# Patient Record
Sex: Female | Born: 1980 | Race: White | Hispanic: No | Marital: Married | Smoking: Current every day smoker
Health system: Southern US, Community
[De-identification: ages and names within clinical notes are randomized; demographics above are authoritative.]

## PROBLEM LIST (undated history)

## (undated) DIAGNOSIS — K219 Gastro-esophageal reflux disease without esophagitis: Secondary | ICD-10-CM

## (undated) DIAGNOSIS — R112 Nausea with vomiting, unspecified: Secondary | ICD-10-CM

## (undated) DIAGNOSIS — R7303 Prediabetes: Secondary | ICD-10-CM

## (undated) DIAGNOSIS — T4145XA Adverse effect of unspecified anesthetic, initial encounter: Secondary | ICD-10-CM

## (undated) DIAGNOSIS — J302 Other seasonal allergic rhinitis: Secondary | ICD-10-CM

## (undated) DIAGNOSIS — D649 Anemia, unspecified: Secondary | ICD-10-CM

## (undated) DIAGNOSIS — E282 Polycystic ovarian syndrome: Secondary | ICD-10-CM

## (undated) DIAGNOSIS — Z9889 Other specified postprocedural states: Secondary | ICD-10-CM

## (undated) DIAGNOSIS — T8859XA Other complications of anesthesia, initial encounter: Secondary | ICD-10-CM

## (undated) DIAGNOSIS — C801 Malignant (primary) neoplasm, unspecified: Secondary | ICD-10-CM

## (undated) HISTORY — PX: CERVICAL CERCLAGE: SHX1329

## (undated) HISTORY — PX: CHOLECYSTECTOMY: SHX55

---
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
## 2002-04-16 ENCOUNTER — Other Ambulatory Visit: Admission: RE | Admit: 2002-04-16 | Discharge: 2002-04-16 | Payer: Self-pay | Admitting: Obstetrics and Gynecology

## 2002-06-26 ENCOUNTER — Encounter: Payer: Self-pay | Admitting: Emergency Medicine

## 2002-06-26 ENCOUNTER — Emergency Department (HOSPITAL_COMMUNITY): Admission: EM | Admit: 2002-06-26 | Discharge: 2002-06-26 | Payer: Self-pay | Admitting: Emergency Medicine

## 2002-10-12 ENCOUNTER — Emergency Department (HOSPITAL_COMMUNITY): Admission: EM | Admit: 2002-10-12 | Discharge: 2002-10-12 | Payer: Self-pay | Admitting: Emergency Medicine

## 2002-10-12 ENCOUNTER — Encounter: Payer: Self-pay | Admitting: Emergency Medicine

## 2003-08-17 ENCOUNTER — Other Ambulatory Visit: Admission: RE | Admit: 2003-08-17 | Discharge: 2003-08-17 | Payer: Self-pay | Admitting: Obstetrics and Gynecology

## 2003-11-11 ENCOUNTER — Other Ambulatory Visit: Admission: RE | Admit: 2003-11-11 | Discharge: 2003-11-11 | Payer: Self-pay | Admitting: Obstetrics and Gynecology

## 2004-01-30 ENCOUNTER — Other Ambulatory Visit: Admission: RE | Admit: 2004-01-30 | Discharge: 2004-01-30 | Payer: Self-pay | Admitting: Obstetrics and Gynecology

## 2005-04-10 ENCOUNTER — Other Ambulatory Visit: Admission: RE | Admit: 2005-04-10 | Discharge: 2005-04-10 | Payer: Self-pay | Admitting: Obstetrics and Gynecology

## 2005-12-29 ENCOUNTER — Emergency Department (HOSPITAL_COMMUNITY): Admission: EM | Admit: 2005-12-29 | Discharge: 2005-12-30 | Payer: Self-pay | Admitting: Emergency Medicine

## 2006-10-27 ENCOUNTER — Inpatient Hospital Stay (HOSPITAL_COMMUNITY): Admission: AD | Admit: 2006-10-27 | Discharge: 2006-10-30 | Payer: Self-pay | Admitting: Obstetrics and Gynecology

## 2006-10-28 ENCOUNTER — Encounter: Payer: Self-pay | Admitting: Obstetrics and Gynecology

## 2006-12-10 ENCOUNTER — Inpatient Hospital Stay (HOSPITAL_COMMUNITY): Admission: AD | Admit: 2006-12-10 | Discharge: 2006-12-24 | Payer: Self-pay | Admitting: Obstetrics and Gynecology

## 2006-12-11 ENCOUNTER — Encounter: Payer: Self-pay | Admitting: Obstetrics and Gynecology

## 2006-12-18 ENCOUNTER — Encounter: Payer: Self-pay | Admitting: Obstetrics and Gynecology

## 2006-12-21 ENCOUNTER — Encounter (HOSPITAL_COMMUNITY): Payer: Self-pay | Admitting: Obstetrics and Gynecology

## 2007-07-09 ENCOUNTER — Emergency Department (HOSPITAL_COMMUNITY): Admission: EM | Admit: 2007-07-09 | Discharge: 2007-07-09 | Payer: Self-pay | Admitting: Emergency Medicine

## 2009-10-04 ENCOUNTER — Ambulatory Visit (HOSPITAL_COMMUNITY): Admission: RE | Admit: 2009-10-04 | Discharge: 2009-10-04 | Payer: Self-pay | Admitting: Obstetrics and Gynecology

## 2010-06-26 NOTE — Op Note (Signed)
NAMEDONDI, BURANDT                ACCOUNT NO.:  [id number]   MEDICAL RECORD NO.:  [id number]          PATIENT TYPE:  INP   LOCATION:                                FACILITY:  [facility]   PHYSICIAN:  Michelle L. Grewal, M.D.DATE OF BIRTH:  14-Oct-1980   DATE OF PROCEDURE:  10/29/2006  DATE OF DISCHARGE:  10/30/2006                               OPERATIVE REPORT   PREOP DIAGNOSES:  1. Twin intrauterine pregnancy at 17 weeks.  2. Shortened cervix.   POSTOP DIAGNOSES:  1. Twin intrauterine pregnancy at 17 weeks.  2. Shortened cervix.   PROCEDURE:  McDonald cervical cerclage.   SURGEON:  Dr. Vincente Poli.   ANESTHESIA:  Spinal.   SPECIMENS:  None.   EBL:  Minimal.   PROCEDURE:  Patient was taken to the operating room.  She was then given  her spinal.  She was prepped and draped.  An in-and-out catheter was  used to empty the bladder.  The patient was placed in Trendelenburg  gently which she tolerated very well.  A speculum was placed in the  vagina.  The cervix was noted to be closed but very shortened.  I  grasped the anterior and posterior lips of the cervix with cerclage  forceps and then used Mersilene suture and placed a stitch from the 12  o'clock to 3 o'clock and then 3 o'clock to 6 o'clock and then stitched  from 12 to 9 and then from 9 to 6 o'clock.  This is called McDonald  cervical cerclage.  The knot was tied at 6 o'clock.  At the end of the  procedure, the patient's cervical length had already lengthened some and  the cervix remained closed.  There was minimal bleeding with the  procedure.  The patient tolerated the procedure very well.  She went to  recovery room after all sponge, lap and instrument counts were correct  x2.      Michelle L. Vincente Poli, M.D.  Electronically Signed     MLG/MEDQ  D:  12/04/2006  T:  12/04/2006  Job:  [id number]

## 2010-06-26 NOTE — Op Note (Signed)
[name], [name]                ACCOUNT NO.:  [id number]   MEDICAL RECORD NO.:  [id number]          PATIENT TYPE:  INP   LOCATION:                                [facility]   PHYSICIAN:  Zelphia Cairo, MD    DATE OF BIRTH:  06/12/1980   DATE OF PROCEDURE:  12/21/2006  DATE OF DISCHARGE:  12/18/2006                               OPERATIVE REPORT   PREOPERATIVE DIAGNOSES:  1. Intrauterine pregnancy at 24-3/7 weeks.  2. Preterm labor.  3. Preterm premature rupture of membranes.   POSTOPERATIVE DIAGNOSES:  1. Intrauterine pregnancy at 24-3/7 weeks.  2. Preterm labor.  3. Preterm premature rupture of membranes.   PROCEDURE:  Primary classical cesarean section, cerclage removal.   SURGEON:  Zelphia Cairo, MD   ASSISTANT:  Allie Bossier, MD   ANESTHESIA:  Spinal, Dr. Jean Rosenthal.   FINDINGS:  Viable infant.  Baby A female 570 grams with Apgars of  1, 4  and 5.  Baby B female infant, 545 grams, Apgars of 2, 5 and 5, normal  pelvic anatomy.   COMPLICATIONS:  None.   CONDITION:  Stable to recovery room.   INDICATIONS:  [name] awoke approximately at 3 o'clock a.m. with onset of  increasing contractions.  She denied any vaginal bleeding, heart tones  appeared normal.  Magnesium tocolysis was restarted however, despite 3  grams of mag an hour, the patient continued to labor and make cervical  change.  Primary cesarean section was discussed with the patient due to  fetal lie of vertex transverse.   The patient was taken to the operating room where spinal anesthesia was  obtained.  She was placed in the supine position with the left tilt.  She was prepped and draped in sterile fashion and a Foley catheter was  inserted sterilely.  Pfannenstiel skin incision was made with the  scalpel and this was carried down to the underlying fascia.  The fascia  was incised in the midline.  This was extended laterally using Mayo  scissors.  Kocher clamps were used to grasp the superior portion of  the  fascia.  This was tented upwards and the underlying rectus muscles were  dissected off using the Bovie.  The anterior fascia was tented upwards  and the underlying rectus muscles were dissected off using Mayo  scissors.  Peritoneum was then identified and entered sharply using  Metzenbaum scissors.  This was extended bluntly.  Bladder blade was then  inserted, a bladder flap was created using Metzenbaum scissors.  The  bladder blade was reinserted.  A vertical uterine incision was then made  using the scalpel.  Purulent fluid was noted upon entry to the uterus.  The uterine incision was extended using bandage scissors.  Baby A was  found to be in the vertex position, however low in the pelvis and  delivered using standard breech maneuvers.  Cord was clamped and cut and  the infant was taken to the awaiting pediatric staff.  Baby B was noted  to be transverse position at this time.  Fetal vertex or feet could  not  be grasped.  Membranes were ruptured for clear fluid.  Due to increased  uterine tone, presenting part could not be brought into the uterine  incision. Nitroglycerin was given for relaxation and the fetus was  delivered using standard breech maneuvers.  Cord was clamped and cut and  the infant was taken to the awaiting pediatric staff.  The placenta was  then removed manually.  The uterus was exteriorized from the pelvis and  cleared of all clots and debris using a dry lap sponge.  The uterine  incision was closed into layers using running locked chromic stitch.  The uterus was then placed back into the pelvic cavity.  The pelvis was  copiously irrigated with warm normal saline.  The uterine incision was  reinspected and found to be hemostatic.  The peritoneum was then closed  with 0 Monocryl.  The fascia was closed with 0 PDS.  The subcutaneous  tissue was reapproximated using interrupted plain gut suture and the  skin was closed with staples.  The patient tolerated the  procedure well.  Sponge, lap, needle and instrument counts were correct x2.      Zelphia Cairo, MD  Electronically Signed     GA/MEDQ  D:  12/21/2006  T:  12/22/2006  Job:  [id number]

## 2010-06-26 NOTE — Discharge Summary (Signed)
[name], CADLE                ACCOUNT NO.:  [id number]   MEDICAL RECORD NO.:  [id number]          PATIENT TYPE:  INP   LOCATION:  9157                          [facility]ACILITY:  [facility]   PHYSICIAN:  Michelle L. Grewal, M.D.DATE OF BIRTH:  1981-01-19   DATE OF ADMISSION:  10/27/2006  DATE OF DISCHARGE:  10/30/2006                               DISCHARGE SUMMARY   ADMITTING DIAGNOSES:  Intrauterine pregnancy at 16 weeks twin gestation  with nausea and vomiting and questionable incompetent cervix.   DISCHARGE DIAGNOSES:  1. Status post cervical cerclage.  2. Intrauterine pregnancy at 79 and 3/7 weeks estimated gestational      age.  3. Twin gestation.   PROCEDURE:  McDonald cervical cerclage.   REASON FOR ADMISSION:  Please see written H&P.   HOSPITAL COURSE:  The patient is 30 year old primigravida that was  admitted to [facility] at 16 weeks estimated  gestational age with a twin gestation initially for nausea, vomiting,  but fetal heart tones were unable to be auscultated.  Sonogram was  performed which revealed viable twins.  However, the cervix was noted to  be 1.04 cm in length.  Monitor did not reveal any contractions.  Cervix  was a finger tip, 50% effaced.  It was thought that the patient did have  a possible incompetent cervix.  The patient was now admitted for bedrest  and possible cerclage.  On the following morning the maternal fetal  medicine was consulted for consultation.  The patient was without  complaint.  She denied any cramping, vaginal bleeding or rupture of  membranes.  Vital signs were stable.  She is afebrile.  Fetal heart  tones were auscultated.  Cervix was reexamined and thought to be closed,  approximately 2 cm in length.  The patient continued on IV hydration.  Maternal fetal medicine report said that the fetal anatomy was normal.  However, could not complete the anatomy due to the gestational age.  Cervical length was noted to be 1.7 cm  and it was suggested that the  patient undergo a cervical cerclage and Prometrium.  On the following  morning the patient without complaint.  She denied any leakage of fluid  or vaginal bleeding.  Vital signs were stable.  She was afebrile.  The  patient was now scheduled for cervical cerclage.  The patient was taken  to the operating room where cervical cerclage was placed without  difficulty.  The patient tolerated procedure well and taken to the  recovery room in stable condition.  On the following morning the patient  was doing well.  She denied any uterine contraction or vaginal bleeding,  no contractions were noted on the monitor.  The instructions were given  and the patient was later discharged home.   CONDITION ON DISCHARGE:  Stable.   DIET:  Regular as tolerated.   ACTIVITY:  Bedrest with bathroom privileges.   The patient is to call for decrease in fetal movement, rupture of  membranes or vaginal bleeding or increasing pelvic pressure.   DISCHARGE MEDICATIONS:  Prometrium 200 mg one intravaginal at h.s.,  Phenergan 12.5 mg one p.o. every 12 hours as needed for nausea, prenatal  vitamins one p.o. daily.      Julio Sicks, N.P.      Stann Mainland. Vincente Poli, M.D.  Electronically Signed    CC/MEDQ  D:  11/30/2006  T:  12/01/2006  Job:  [id number]

## 2010-06-29 NOTE — Procedures (Signed)
[place]  Patient:    [name], [name]                          MRN: [id number] Proc. Date: 06/18/00 Adm. Date:  [id number] Attending:  Charna Elizabeth CC:         Kern Reap, M.D.                           Procedure Report  DATE OF BIRTH:  [birth year].  PROCEDURE:  Esophagogastroduodenoscopy.  ENDOSCOPIST:  Anselmo Rod, M.D.  INSTRUMENT USED:  Olympus video panendoscope.  INDICATION FOR PROCEDURE:  An 30 year old white female with a history of epigastric pain, nausea and vomiting.  Rule out peptic ulcer disease, outlet obstruction, esophagitis, etc.  PREPROCEDURE PREPARATION:  Informed consent was procured from the patient. The patient was fasted for eight hours prior to the procedure.  PREPROCEDURE PHYSICAL:  VITAL SIGNS:  The patient had stable vital signs.  NECK:  Supple.  CHEST:  Clear to auscultation.  S1, S2 regular.  ABDOMEN:  Soft with epigastric tenderness on palpation with guarding.  No rebound or rigidity.  No hepatosplenomegaly.  No masses palpable.  DESCRIPTION OF PROCEDURE:  The patient was placed in the left lateral decubitus position and sedated with 100 mg of Demerol and 10 mg of intravenously.  Once the patient was adequately sedate and maintained on low-flow oxygen and continuous cardiac monitoring, the Olympus video panendoscope was advanced through the mouthpiece, over the tongue, into the esophagus under direct vision.  The entire esophagus appeared normal without evidence of ring, stricture, masses, lesions, or esophagitis.  The scope was then advanced to the stomach.  There was a large amount of debris in the stomach, and the scope could not be advanced beyond the pylorus.  The procedure was aborted at that point.  IMPRESSION: 1. Incomplete endoscopy secondary to a large amount of residual debris in the    stomach consistent with gastroparesis. 2. Normal-appearing esophagus.  RECOMMENDATIONS: 1.  Gastric emptying study has been scheduled for the patient. 2. A CBC with differential, CMET, and  a hemoglobin A1C will be checked today. 3. An abdominal ultrasound and HIDA scan will also be done to rule out    gallbladder pathology. 4. Patient is to continue her PPIs for now and avoid all nonsteroidals,    including aspirin. 5. Outpatient follow-up is advised within the next week. DD:  06/18/00 TD:  06/19/00 Job: [id number]

## 2010-06-29 NOTE — Discharge Summary (Signed)
[name], [name]                ACCOUNT NO.:  [id number]   MEDICAL RECORD NO.:  [id number]          PATIENT TYPE:  INP   LOCATION:  9304                          FACILITY:  [facility]   PHYSICIAN:  [name], M.D.   DATE OF BIRTH:  December 20, 1980   DATE OF ADMISSION:  12/10/2006  DATE OF DISCHARGE:  12/24/2006                               DISCHARGE SUMMARY   ADMITTING DIAGNOSES:  1. Intrauterine pregnancy at 22-6/7 weeks estimated gestational age,      twin gestation.  2. Twin gestation.  3. Incompetent cervix.  4. Preterm labor.   DISCHARGE DIAGNOSES:  1. Status post low classical cesarean delivery.  2. Viable twin infant female and female infant.  3. Cerclage removal.   PROCEDURES:  1. Primary classical cesarean section.  2. Cerclage removal.   REASON FOR ADMISSION:  Please see written H&P.   HOSPITAL COURSE:  The patient is a 30 year old white married female,  primigravida that was admitted to [facility] at 22-6/7  weeks estimated gestational age for observation and bedrest.  The  patient had been seen in the office where an ultrasound had revealed  cervical length of 0.7 cm and funneling of the lower uterine segment.  Ultrasound had also revealed that there was adequate amniotic fluid  volume and symmetrical growth of twin infants.  The patient was placed  on fetal monitoring, which had revealed no contractions observable by  tocometer.  However, given shortening of the cervix, the patient was  started on progesterone vaginal suppositories.  The patient was  monitored closely and continued on bedrest.  Fetal heart tones continued  to be reactive in the 140s.  The patient was placed on magnesium sulfate  for tocolysis of any contraction pattern.  Cerclage was seen and seemed  to be intact.  Fetal heart tones remained reactive.  The patient did  receive betamethasone for acceleration of the fetal lung maturity.  She  also had been placed on compression  stockings for prevention of deep  vein thrombosis.  The patient did undergo glucose testing, which she had  failed a 3-hour glucose tolerance test and was now started on American  Diabetes diet and metformin.  At approximately 24-2/7 weeks, the patient  did report a large gush of fluid, which was positive for Nitrazine.  Ultrasound revealed a decrease in amniotic fluid on both baby A and B.  She was placed on prophylactic antibiotics.  On the following day, the  patient did complain of some contractions.  She was now started back on  magnesium sulfate which was increased to 3 grams per hour.  The patient  continued to be uncomfortable.  No vaginal bleeding noted.  Contractions  were approximately every 3 minutes.  Exam revealed cervix 1 cm with  presenting part palpable.  Cerclage continued to be in place.  Due to  continued labor at 3 grams per hour, decision was made to proceed with a  classical cesarean delivery.  The patient was then transferred to the  operating room where spinal anesthesia was placed without difficulty.  A  low  classical incision was made with the delivery of a viable infant A  female weighing 1 pound, 4 ounces, Apgars of 1 at 1 minute, 4 at 5  minutes, 5 at 10 minutes; and baby B, a female infant weighing 1 pound, 3-  1/2 ounces with Apgars of 2 at 1 minute, 5 at 5 minutes, 5 at 10  minutes.  Babies were taken to the Neonatal Intensive Care Unit, and  mother tolerated the procedure well and was taken to the recovery room  in stable condition.  On postoperative day #1, mother was without  complaint.  Babies were both stable in the NICU.  Vital signs were  stable.  She was afebrile.  Abdomen was soft.  Fundus firm and  nontender.  Abdominal dressings were noted to have a scant amount of  drainage on the bandage.  Foley had been discontinued.  However, the  patient had not voided at the time of rounding.  Laboratory findings  showed a hemoglobin of 9.5, platelet count of  245,000, WBC count of  10.5.  On postoperative day #2, the patient was without complaint.  Vital signs were stable.  Fundus firm and nontender.  Incision was  clean, dry, and intact.  She was ambulating well.  On postoperative day  #3, she was somewhat tearful.  Babies, however, were stable in the NICU.  Vital signs were stable.  She was afebrile.  Abdomen was soft.  Fundus  firm and nontender.  Incision was clean, dry, and intact.  Staples were  removed, and the patient was later discharged home.   CONDITION ON DISCHARGE:  Stable.   DIET:  Regular as tolerated.   ACTIVITY:  No heavy lifting.  No driving x2 weeks.  No vaginal entry.   FOLLOW UP:  The patient will follow up in the office in 1-2 weeks for an  incision check.  She is to call for temperature greater than 100  degrees, persistent nausea, vomiting, heavy vaginal bleeding and/or  redness drainage from the incisional site.   DISCHARGE MEDICATIONS:  Percocet 5/325, #30, one by mouth every 4 to 6  hours p.r.n.; Motrin 600 mg by mouth every 6 hours; Zoloft 25 mg x1  week, then 50 mg daily; prenatal vitamins one by mouth daily.      [name], N.P.      [name], M.D.  Electronically Signed    CC/MEDQ  D:  02/11/2007  T:  02/11/2007  Job:  [id number]

## 2010-06-29 NOTE — Op Note (Signed)
[facility]  Patient:    [name], [name]                          MRN: [id number] Proc. Date: 08/01/00 Adm. Date:  [id number] Attending:  Arlis Porta CC:         Kern Reap, M.D.  Anselmo Rod, M.D.   Operative Report  PREOPERATIVE DIAGNOSIS:  Biliary dyskinesia.  POSTOPERATIVE DIAGNOSIS:  Biliary dyskinesia.  PROCEDURE:  Laparoscopic cholecystectomy with intraoperative cholangiogram.  SURGEON:  Adolph Pollack, M.D.  ASSISTANT:  Thornton Park. Daphine Deutscher, M.D.  ANESTHESIA:  General.  INDICATIONS:  [name] is an 30 year old female, who has been having some abdominal pain and hematemesis.  She has postprandial nausea and some epigastric and right upper quadrant type pains.  She underwent upper endoscopy which demonstrated a large amount of residual debris in the stomach.  She had delayed gastric emptying and was treated with Reglan with improvement in symptoms.  However, she continues to have some postprandial right upper quadrant and epigastric pain with nausea.  She has had an ultrasound of the gallbladder which was normal.  However, hepatobiliary scan showed delayed ejection fraction consistent with biliary dyskinesia.  She has been tried on antispasmodics for possible irritable bowel, but this has not helped the symptoms.  She now presents for elected cholecystectomy.  The procedure risks and potential success of the procedure with respect to relieving symptoms (60-80%) has been discussed with her.  TECHNIQUE:  She was placed supine on the operating table, and general anesthetic was administered.  The abdomen was sterilely prepped and draped. Local anesthetic consisting of 0.5% plain Marcaine was infiltrated in the subumbilical region and incision made and carried down through the subcutaneous tissue to the fascia where a 1.5 cm incision was made in the midline fascia.  A pursestring suture of 0 Vicryl was placed around  the fascial edges.  The peritoneal cavity was then entered bluntly under direct vision.  A Hasson trocar was introduced into the peritoneal cavity, and a pneumoperitoneum was created by insufflation of C02 gas.  Next, the laparoscope was introduced.  Laparoscope was introduced, and the surface of the liver appeared normal.  The surface of the stomach appeared normal.  Under direct vision, an 11 mm incision in the epigastrium through which a similar sized trocar was inserted. Two 5 mm trocars were placed through a small incision in the right abdomen. The fundus was grasped and the infundibulum identified.  The infundibulum was completely mobilized.  I then was able to dissect the cystic duct at its junction with the gallbladder and for a distance.  I put a clip up on the gallbladder at the gallbladder neck.  An incision was made at the cystic duct gallbladder junction.  A Cholangiocath was passed through the anterior abdominal wall, and the cholangiogram was performed.  With real-time fluoroscopy, the dilute contrast material was injected into the cystic duct.  The cystic duct was long and thin.  The material went rapidly through the common bile duct into the duodenum.  I also noted the common hepatic and right and left hepatic ducts.  No obstructive lesions were apparent.  Final reading is per the radiologist.  I removed the Cholangiocath catheter. I clipped the cystic duct three times proximally and divided it.  The cystic artery was clipped and divided.  The gallbladder was dissected free from the liver bed.  There was a small puncture wound in  the gallbladder and bile leaked out.  Once I removed the gallbladder from its fossa, I placed it in an Endopouch bag and irrigated out the perihepatic area.  I then inspected the liver bed and noticed some raw surface from which there was some bleeding.  I controlled this with the cautery.  Once there was hemostasis, I then applied Surgicel to  the raw surface.  I removed the gallbladder in the Endopouch bag through the subumbilical incision and then closed the fascial defect under direct vision by tightening up and tying down the pursestring suture.  I subsequently irrigated out the perihepatic area and evacuated all of the fluid until it was clear.  I then removed all of the trocars and released the pneumoperitoneum.  The skin incisions were closed with 4-0 Monocryl subcuticular stitches followed by Steri-Strips and sterile dressings.  She tolerated the procedure well without any apparent complications and was taken to the recovery room in satisfactory condition. DD:  08/01/00 TD:  08/01/00 Job: [id number]

## 2010-11-07 LAB — URINALYSIS, ROUTINE W REFLEX MICROSCOPIC
Ketones, ur: 15 — AB
Nitrite: NEGATIVE
pH: 5.5

## 2010-11-07 LAB — DIFFERENTIAL
Basophils Absolute: 0
Eosinophils Absolute: 0.2
Eosinophils Relative: 1
Lymphocytes Relative: 13
Monocytes Absolute: 0.6

## 2010-11-07 LAB — COMPREHENSIVE METABOLIC PANEL
ALT: 34
AST: 31
Albumin: 4.1
Calcium: 9.2
GFR calc Af Amer: >60
Potassium: 4.1
Sodium: 139
Total Protein: 7.2

## 2010-11-07 LAB — CBC
HCT: 42.8
Hemoglobin: 14.6
MCHC: 34
MCV: 79.6
RDW: 14.9

## 2010-11-07 LAB — URINE MICROSCOPIC-ADD ON

## 2010-11-07 LAB — WET PREP, GENITAL: Yeast Wet Prep HPF POC: NONE SEEN

## 2010-11-07 LAB — RPR: RPR Ser Ql: NONREACTIVE

## 2010-11-07 LAB — GC/CHLAMYDIA PROBE AMP, GENITAL: Chlamydia, DNA Probe: NEGATIVE

## 2010-11-20 LAB — CBC
HCT: 27.7 — ABNORMAL LOW
HCT: 30.6 — ABNORMAL LOW
HCT: 33.1 — ABNORMAL LOW
Hemoglobin: 10.6 — ABNORMAL LOW
Hemoglobin: 9.5 — ABNORMAL LOW
MCHC: 34.5
MCV: 85.4
MCV: 86
Platelets: 235
Platelets: 285
RBC: 3.22 — ABNORMAL LOW
RBC: 3.85 — ABNORMAL LOW
RDW: 13.8
RDW: 14.2
WBC: 17 — ABNORMAL HIGH

## 2010-11-20 LAB — URINE CULTURE
Colony Count: 10000
Special Requests: NEGATIVE

## 2010-11-20 LAB — URINALYSIS, ROUTINE W REFLEX MICROSCOPIC
Bilirubin Urine: NEGATIVE
Glucose, UA: NEGATIVE
Protein, ur: NEGATIVE
Urobilinogen, UA: 0.2

## 2010-11-20 LAB — TYPE AND SCREEN: ABO/RH(D): O POS

## 2010-11-20 LAB — GLUCOSE TOLERANCE, 1 HOUR: Glucose, 1 Hour GTT: 150 — ABNORMAL HIGH

## 2010-11-20 LAB — GLUCOSE, 2 HOUR GESTATIONAL: Glucose Tolerance, 2 hour: 254 — ABNORMAL HIGH

## 2010-11-20 LAB — ABO/RH: ABO/RH(D): O POS

## 2010-11-21 LAB — CBC
Hemoglobin: 11.3 — ABNORMAL LOW
MCHC: 35
MCV: 85.5
RBC: 3.79 — ABNORMAL LOW
RDW: 14.2 — ABNORMAL HIGH

## 2010-11-22 LAB — URINALYSIS, ROUTINE W REFLEX MICROSCOPIC
Bilirubin Urine: NEGATIVE
Glucose, UA: NEGATIVE
Hgb urine dipstick: NEGATIVE
Ketones, ur: 15 — AB
Protein, ur: NEGATIVE
Urobilinogen, UA: 0.2

## 2011-07-10 ENCOUNTER — Other Ambulatory Visit: Payer: Self-pay | Admitting: Obstetrics and Gynecology

## 2012-07-29 ENCOUNTER — Other Ambulatory Visit: Payer: Self-pay | Admitting: Obstetrics and Gynecology

## 2012-10-01 ENCOUNTER — Ambulatory Visit: Payer: Self-pay | Admitting: Cardiovascular Disease

## 2013-08-18 ENCOUNTER — Other Ambulatory Visit: Payer: Self-pay | Admitting: Obstetrics and Gynecology

## 2013-08-20 LAB — CYTOLOGY - PAP

## 2014-08-29 ENCOUNTER — Other Ambulatory Visit: Payer: Self-pay | Admitting: Obstetrics and Gynecology

## 2014-08-30 LAB — CYTOLOGY - PAP

## 2016-02-12 NOTE — L&D Delivery Note (Signed)
Patient delivered spontaneously a non viable fetus  Called by nurse shortly after delivery  Exam  Placenta still undelivered Bleeding is minimal 277mcg cytotec inserted in vagina to assist in delivery Of placenta  Plan of care reviewed at bedside

## 2016-03-19 LAB — OB RESULTS CONSOLE RPR: RPR: NONREACTIVE

## 2016-03-19 LAB — OB RESULTS CONSOLE GBS: GBS: NEGATIVE

## 2016-03-19 LAB — OB RESULTS CONSOLE RUBELLA ANTIBODY, IGM: Rubella: NON-IMMUNE/NOT IMMUNE

## 2016-03-19 LAB — OB RESULTS CONSOLE HEPATITIS B SURFACE ANTIGEN: Hepatitis B Surface Ag: NEGATIVE

## 2016-03-19 LAB — OB RESULTS CONSOLE ABO/RH: RH Type: POSITIVE

## 2016-03-19 LAB — OB RESULTS CONSOLE HIV ANTIBODY (ROUTINE TESTING): HIV: NONREACTIVE

## 2016-04-12 NOTE — Patient Instructions (Signed)
Your procedure is scheduled on:  Thursday, April 18, 2016  Enter through the [employer] [facility] at:  12 noon  Pick up the phone at the desk and dial [phone].  Call this number if you have problems the morning of surgery: [phone].  Remember: Do NOT eat food:  After 5:30 AM day of surgery  Do NOT drink clear liquids after:  9:30 AM day of surgery  Take these medicines the morning of surgery with a SIP OF WATER:  None  Do NOT take Metformin the evening before day of surgery  Stop ALL herbal medications at this time  Do NOT smoke the day of surgery.  Do NOT wear jewelry (body piercing), metal hair clips/bobby pins, make-up, or nail polish. Do NOT wear lotions, powders, or perfumes.  You may wear deodorant. Do NOT shave for 48 hours prior to surgery. Do NOT bring valuables to the hospital. Contacts, dentures, or bridgework may not be worn into surgery.  Have a responsible adult drive you home and stay with you for 24 hours after your procedure  Bring a copy of your healthcare power of attorney and living will documents.  **Effective Friday, Jan. 12, 2018, [place] will implement no hospital visitations from children age 60 and younger due to a steady increase in flu activity in our community and hospitals. **

## 2016-04-15 ENCOUNTER — Encounter (HOSPITAL_COMMUNITY): Payer: Self-pay | Admitting: *Deleted

## 2016-04-15 ENCOUNTER — Inpatient Hospital Stay (HOSPITAL_COMMUNITY): Payer: No Typology Code available for payment source

## 2016-04-15 ENCOUNTER — Encounter (HOSPITAL_COMMUNITY)
Admission: RE | Admit: 2016-04-15 | Discharge: 2016-04-15 | Disposition: A | Discharge status: Home / Self Care | Payer: No Typology Code available for payment source | Source: Ambulatory Visit | Attending: Obstetrics and Gynecology | Admitting: Obstetrics and Gynecology

## 2016-04-15 ENCOUNTER — Observation Stay (HOSPITAL_COMMUNITY)
Admission: AD | Admit: 2016-04-15 | Discharge: 2016-04-16 | Disposition: A | Discharge status: Home / Self Care | Payer: No Typology Code available for payment source | Source: Ambulatory Visit | Attending: Obstetrics and Gynecology | Admitting: Obstetrics and Gynecology

## 2016-04-15 ENCOUNTER — Encounter (HOSPITAL_COMMUNITY): Payer: Self-pay

## 2016-04-15 DIAGNOSIS — O42112 Preterm premature rupture of membranes, onset of labor more than 24 hours following rupture, second trimester: Secondary | ICD-10-CM | POA: Diagnosis present

## 2016-04-15 DIAGNOSIS — O4290 Premature rupture of membranes, unspecified as to length of time between rupture and onset of labor, unspecified weeks of gestation: Secondary | ICD-10-CM | POA: Diagnosis not present

## 2016-04-15 DIAGNOSIS — Z3A15 15 weeks gestation of pregnancy: Secondary | ICD-10-CM | POA: Insufficient documentation

## 2016-04-15 DIAGNOSIS — Z7984 Long term (current) use of oral hypoglycemic drugs: Secondary | ICD-10-CM | POA: Insufficient documentation

## 2016-04-15 DIAGNOSIS — O2622 Pregnancy care for patient with recurrent pregnancy loss, second trimester: Secondary | ICD-10-CM | POA: Insufficient documentation

## 2016-04-15 DIAGNOSIS — O42912 Preterm premature rupture of membranes, unspecified as to length of time between rupture and onset of labor, second trimester: Secondary | ICD-10-CM | POA: Diagnosis not present

## 2016-04-15 DIAGNOSIS — O2620 Pregnancy care for patient with recurrent pregnancy loss, unspecified trimester: Secondary | ICD-10-CM

## 2016-04-15 DIAGNOSIS — Z3492 Encounter for supervision of normal pregnancy, unspecified, second trimester: Secondary | ICD-10-CM

## 2016-04-15 HISTORY — DX: Gastro-esophageal reflux disease without esophagitis: K21.9

## 2016-04-15 HISTORY — DX: Other complications of anesthesia, initial encounter: T88.59XA

## 2016-04-15 HISTORY — DX: Nausea with vomiting, unspecified: R11.2

## 2016-04-15 HISTORY — DX: Adverse effect of unspecified anesthetic, initial encounter: T41.45XA

## 2016-04-15 HISTORY — DX: Other specified postprocedural states: Z98.890

## 2016-04-15 HISTORY — DX: Polycystic ovarian syndrome: E28.2

## 2016-04-15 HISTORY — DX: Anemia, unspecified: D64.9

## 2016-04-15 HISTORY — DX: Other seasonal allergic rhinitis: J30.2

## 2016-04-15 LAB — URINALYSIS, ROUTINE W REFLEX MICROSCOPIC
Bilirubin Urine: NEGATIVE
Glucose, UA: NEGATIVE mg/dL
HGB URINE DIPSTICK: NEGATIVE
Ketones, ur: NEGATIVE mg/dL
LEUKOCYTES UA: NEGATIVE
Nitrite: NEGATIVE
PROTEIN: NEGATIVE mg/dL
Specific Gravity, Urine: 1.002 — ABNORMAL LOW (ref 1.005–1.030)
pH: 8 (ref 5.0–8.0)

## 2016-04-15 LAB — TYPE AND SCREEN
ABO/RH(D): O POS
Antibody Screen: NEGATIVE

## 2016-04-15 LAB — CBC
HCT: 41.3 % (ref 36.0–46.0)
HEMOGLOBIN: 14 g/dL (ref 12.0–15.0)
MCH: 27.7 pg (ref 26.0–34.0)
MCHC: 33.9 g/dL (ref 30.0–36.0)
MCV: 81.8 fL (ref 78.0–100.0)
PLATELETS: 204 10*3/uL (ref 150–400)
RBC: 5.05 MIL/uL (ref 3.87–5.11)
RDW: 14.6 % (ref 11.5–15.5)
WBC: 11.2 10*3/uL — AB (ref 4.0–10.5)

## 2016-04-15 LAB — BASIC METABOLIC PANEL
Anion gap: 11 (ref 5–15)
BUN: 8 mg/dL (ref 6–20)
CALCIUM: 8.9 mg/dL (ref 8.9–10.3)
CO2: 17 mmol/L — AB (ref 22–32)
CREATININE: 0.57 mg/dL (ref 0.44–1.00)
Chloride: 104 mmol/L (ref 101–111)
GFR calc Af Amer: >60 mL/min (ref >60)
GFR calc non Af Amer: >60 mL/min (ref >60)
GLUCOSE: 114 mg/dL — AB (ref 65–99)
Potassium: 3.5 mmol/L (ref 3.5–5.1)
Sodium: 132 mmol/L — ABNORMAL LOW (ref 135–145)

## 2016-04-15 LAB — AMNISURE RUPTURE OF MEMBRANE (ROM) NOT AT ARMC: Amnisure ROM: POSITIVE

## 2016-04-15 MED ORDER — ACETAMINOPHEN 500 MG PO TABS
1000.0000 mg | ORAL_TABLET | Freq: Three times a day (TID) | ORAL | Status: DC | PRN
  Administered 2016-04-15: 1000 mg via ORAL
  Filled 2016-04-15: qty 2

## 2016-04-15 MED ORDER — PRENATAL MULTIVITAMIN CH: 1.0000 | ORAL_TABLET | Freq: Every day | ORAL | Status: DC

## 2016-04-15 NOTE — H&P (Signed)
[name] is a 36 y.o. female presenting for early preg, ?leaking fluid, pt w/ hx incompt cx>>sched for cerclage this week. OB History    Gravida Para Term Preterm AB Living   4 1   1 2      SAB TAB Ectopic Multiple Live Births   2     1 2      Past Medical History:  Diagnosis Date  . Anemia    history with pregnancy  . Complication of anesthesia    difficult time waking up after gallbladder surgery, blood pressure drop with C section after babies delivered  . GERD (gastroesophageal reflux disease)   . PCOS (polycystic ovarian syndrome)   . PONV (postoperative nausea and vomiting)   . Seasonal allergies    Past Surgical History:  Procedure Laterality Date  . CERVICAL CERCLAGE    . CESAREAN SECTION    . CHOLECYSTECTOMY     Family History: family history is not on file. Social History:  reports that she has never smoked. She has never used smokeless tobacco. She reports that she drinks alcohol. She reports that she does not use drugs.     Maternal Diabetes: No Genetic Screening: N/A Maternal Ultrasounds/Referrals: Normal Fetal Ultrasounds or other Referrals:  None Maternal Substance Abuse:  No Significant Maternal Medications:  None Significant Maternal Lab Results:  None Other Comments:  None  Review of Systems  Constitutional: Negative.   HENT: Negative.   Eyes: Negative.   Respiratory: Negative.   Gastrointestinal: Negative.   Genitourinary: Negative.   Musculoskeletal: Negative.   Skin: Negative.   Neurological: Negative.   Endo/Heme/Allergies: Negative.   Psychiatric/Behavioral: Negative.    Maternal Medical History:  Reason for admission: Rupture of membranes.   Contractions: Onset was 3-5 hours ago.   Frequency: rare.        Blood pressure 134/81, pulse 115, temperature 98.4 F (36.9 C), temperature source Oral, resp. rate 20, weight 105.1 kg (231 lb 12 oz), last menstrual period 01/02/2016, SpO2 100 %. Exam Physical Exam  Constitutional: She  is oriented to person, place, and time. She appears well-nourished.  HENT:  Head: Normocephalic and atraumatic.  Neck: Normal range of motion. Neck supple.  Cardiovascular: Normal rate and regular rhythm.   Respiratory: Effort normal and breath sounds normal.  GI: Bowel sounds are normal.  Genitourinary:  Genitourinary Comments: Per NP exam>>opaque fluid in vag + AMNISURE  Musculoskeletal: Normal range of motion.  Neurological: She is alert and oriented to person, place, and time.    Prenatal labs: ABO, Rh: --/--/O POS (03/05 1111) Antibody: NEG (03/05 1111) Rubella:   RPR:    HBsAg:    HIV:    GBS:     Assessment/Plan: [redacted]w[redacted]d Hx incomt cx>>sched for cerclage this week + AMNISURE and US showing low AF>>>offered OPT mgmt, pt request overnight eval, plan f/U US in am   [facility] M 04/15/2016, 9:40 PM

## 2016-04-15 NOTE — MAU Provider Note (Signed)
History     CSN: [id number]  Arrival date and time: 04/15/16 [id number]   First Provider Initiated Contact with Patient 04/15/16 1818      Chief Complaint  Patient presents with  . Rupture of Membranes   HPI [name] is 36 y.o. G4P0120 [redacted]w[redacted]d weeks by date but patient states she is [redacted]w[redacted]d by U/S X 3 presenting with concerns of premature rupture of membranes. Hx of PCOS, irregular cycles.  Last BM 4 days ago, thought she would go today, unsuccesffully and while on toilet had leaking, didn't know if urine.  15 mins later while sitting on couch had another gush of fluid. Went to [facility] and was able to void and felt more leaking 15 mins later.  "Felt like leaking with cough or sneezing but I am not doing either"  Hx of rescue cerclage at [redacted] weeks gestation with twin pregnancy.  Pregnancy loss at 24 weeks.  2 SABs after pregnancy loss. She states no changes in cervix with this pregnancy.  Cerclage is scheduled for Thursday 3/8.  Neg for vaginal bleeding or abdominal cramping.  She is a patient of Dr. Christen Butter.     Past Medical History:  Diagnosis Date  . Anemia    history with pregnancy  . Complication of anesthesia    difficult time waking up after gallbladder surgery, blood pressure drop with C section after babies delivered  . GERD (gastroesophageal reflux disease)   . PCOS (polycystic ovarian syndrome)   . PONV (postoperative nausea and vomiting)   . Seasonal allergies     Past Surgical History:  Procedure Laterality Date  . CERVICAL CERCLAGE    . CESAREAN SECTION    . CHOLECYSTECTOMY      History reviewed. No pertinent family history.  Social History  Substance Use Topics  . Smoking status: Never Smoker  . Smokeless tobacco: Never Used  . Alcohol use Yes     Comment: none since pregnancy    Allergies:  Allergies  Allergen Reactions  . Sulfa Antibiotics Anaphylaxis    Prescriptions Prior to Admission  Medication Sig Dispense Refill Last Dose  . calcium carbonate (TUMS -  DOSED IN MG ELEMENTAL CALCIUM) 500 MG chewable tablet Chew 2 tablets by mouth 2 (two) times daily as needed for indigestion or heartburn.   Past Week at Unknown time  . metFORMIN (GLUCOPHAGE) 500 MG tablet Take 500 mg by mouth 2 (two) times daily with a meal.   04/15/2016 at Unknown time  . Prenatal Vit-Fe Fumarate-FA (PRENATAL MULTIVITAMIN) TABS tablet Take 1 tablet by mouth daily at 12 noon.   04/14/2016 at Unknown time  . Progesterone 50 MG SUPP Place 1 suppository vaginally 2 (two) times daily.   04/15/2016 at Unknown time  . pseudoephedrine (SUDAFED) 60 MG tablet Take 120 mg by mouth every 4 (four) hours as needed for congestion.   04/15/2016 at Unknown time    Review of Systems  Constitutional: Negative for fever.  Gastrointestinal: Negative for abdominal pain.  Genitourinary: Negative.  Negative for pelvic pain, urgency and vaginal bleeding.       Leaking of ? Vaginal fluid X 4 this afternoon.  Musculoskeletal: Negative for back pain.   Physical Exam   Blood pressure 134/81, pulse 115, temperature 98.4 F (36.9 C), temperature source Oral, resp. rate 20, weight 231 lb 12 oz (105.1 kg), last menstrual period 01/02/2016, SpO2 100 %.  Physical Exam  Constitutional: She is oriented to person, place, and time. She appears well-developed and well-nourished.  No distress.  HENT:  Head: Normocephalic.  Neck: Normal range of motion.  Cardiovascular: Normal rate.   Respiratory: Effort normal.  GI: Soft.  Genitourinary:  Genitourinary Comments: Gentle insertion of sterile speculum.  Noted is small amount of pooling --opaque fluid without signs of bleeding.   Neurological: She is alert and oriented to person, place, and time. She has normal reflexes.  Skin: Skin is warm and dry.  Psychiatric: She has a normal mood and affect. Her behavior is normal. Judgment and thought content normal.   Results for orders placed or performed during the hospital encounter of 04/15/16 (from the past 24 hour(s))   Urinalysis, Routine w reflex microscopic     Status: Abnormal   Collection Time: 04/15/16  5:35 PM  Result Value Ref Range   Color, Urine COLORLESS (A) YELLOW   APPearance CLEAR CLEAR   Specific Gravity, Urine 1.002 (L) 1.005 - 1.030   pH 8.0 5.0 - 8.0   Glucose, UA NEGATIVE NEGATIVE mg/dL   Hgb urine dipstick NEGATIVE NEGATIVE   Bilirubin Urine NEGATIVE NEGATIVE   Ketones, ur NEGATIVE NEGATIVE mg/dL   Protein, ur NEGATIVE NEGATIVE mg/dL   Nitrite NEGATIVE NEGATIVE   Leukocytes, UA NEGATIVE NEGATIVE  Amnisure rupture of membrane (rom)not at [facility]     Status: None   Collection Time: 04/15/16  7:47 PM  Result Value Ref Range   Amnisure ROM POSITIVE    MAU Course  Procedures  MDM MSE U/S Consulted with Dr. Matthew Saras, reported Patient Hx and MSE.  Orders given for U/S U/S-prelim report: FHR 173.  Oligohydraminos Largest pocket of AF 2.3cm.  Small amt of fluid for Gestational age.  [redacted]w[redacted]d by U/S.  Cervical exam-uterine contraction through exam but appears closed.  Reported U/S results to Dr. Matthew Saras.  Order given for speculum exam and Amnisure.   Lab-Amnisure- positive Reported lab results to Dr. Matthew Saras.  Asked to offer patient choice of going home to rest tonight, follow up visit and U/S in am Vs admit for Observation with F/U U/S tomorrow am.  Options given patient more comfortable to stay for observation.  Dr. Matthew Saras informed.  Orders given for BR with BRP, regular diet and F/U US in the am to evaluate fetal viability and Amniotic fluid .  Assessment and Plan  A:  Premature ruptureof membranes---      Second trimester pregnancy at [redacted]w[redacted]d by U/S      Hx of cerclage at 16 weeks with twin pregnancy      Twin pregnancy loss at [redacted] week gestation      Hx of Miscarriage X 2.      Ogliohydramnios      Positive Amnisure   P: ADMIT for observation  Eve M Etienne Mowers 04/15/2016, 8:05 PM

## 2016-04-15 NOTE — MAU Note (Signed)
went to the bathroom around 4, after she finished it felt like another gush came out.  Later was sitting on couch, felt another gush.? Went back to br, had a small gush, but urinated also. Supposed to be getting a cerclage on Thursday

## 2016-04-16 ENCOUNTER — Observation Stay (HOSPITAL_COMMUNITY): Payer: No Typology Code available for payment source

## 2016-04-16 MED ORDER — AMOXICILLIN-POT CLAVULANATE 875-125 MG PO TABS: 1.0000 | ORAL_TABLET | Freq: Two times a day (BID) | ORAL | 0 refills | Status: AC

## 2016-04-16 NOTE — Discharge Summary (Signed)
Physician Discharge Summary  Patient ID: LADAIJA VACCARELLI MRN: [id number] DOB/AGE: May 17, 1980 36 y.o.  Admit date: 04/15/2016 Discharge date: 04/16/2016  Admission Diagnoses:PPROM at 13 weeks, incompetent cervix  Discharge Diagnoses: Same Active Problems:   Amniotic fluid leaking   Discharged Condition: stable  Hospital Course: Admitted with PPROM and monitored overnight.  [place] in am showed persistent low fluid c/w PPROM.  Pt d/cd home on Augmentin and increased rest to fu in office with [place].    Consults: None  Significant Diagnostic Studies: Ultrasounds  Treatments: monitoring with dopplers, and Ultrasound  Discharge Exam: Blood pressure 121/75, pulse (!) 103, temperature 98.7 F (37.1 C), temperature source Oral, resp. rate 18, weight 231 lb 12 oz (105.1 kg), last menstrual period 01/02/2016, SpO2 99 %. General appearance: alert, cooperative, appears stated age and no distress GI: soft, non-tender; bowel sounds normal; no masses,  no organomegaly, non tender  Disposition:   Discharge Instructions    Call MD for:  difficulty breathing, headache or visual disturbances    Complete by:  As directed    Call MD for:  persistant nausea and vomiting    Complete by:  As directed    Call MD for:  redness, tenderness, or signs of infection (pain, swelling, redness, odor or green/yellow discharge around incision site)    Complete by:  As directed    Call MD for:  severe uncontrolled pain    Complete by:  As directed    Call MD for:  temperature >100.4    Complete by:  As directed    Diet general    Complete by:  As directed    Increase activity slowly    Complete by:  As directed      Allergies as of 04/16/2016      Reactions   Sulfa Antibiotics Anaphylaxis      Medication List    STOP taking these medications   Progesterone 50 MG Supp     TAKE these medications   amoxicillin-clavulanate 875-125 MG tablet Commonly known as:  AUGMENTIN Take 1 tablet by mouth 2 (two) times  daily.   calcium carbonate 500 MG chewable tablet Commonly known as:  TUMS - dosed in mg elemental calcium Chew 2 tablets by mouth 2 (two) times daily as needed for indigestion or heartburn.   metFORMIN 500 MG tablet Commonly known as:  GLUCOPHAGE Take 500 mg by mouth 2 (two) times daily with a meal.   prenatal multivitamin Tabs tablet Take 1 tablet by mouth daily at 12 noon.   pseudoephedrine 60 MG tablet Commonly known as:  SUDAFED Take 120 mg by mouth every 4 (four) hours as needed for congestion.        Signed: Romell Cavanah C 04/16/2016, 10:20 AM

## 2016-04-16 NOTE — Progress Notes (Signed)
Discharge instructions reviewed with patient. Patient verbalizes an understanding of discharge instructions. Discharged via wheelchair with family.

## 2016-04-18 ENCOUNTER — Ambulatory Visit (HOSPITAL_COMMUNITY)
Admission: RE | Admit: 2016-04-18 | Payer: No Typology Code available for payment source | Source: Ambulatory Visit | Admitting: Obstetrics and Gynecology

## 2016-04-18 ENCOUNTER — Encounter (HOSPITAL_COMMUNITY): Admission: RE | Payer: Self-pay | Source: Ambulatory Visit

## 2016-04-18 SURGERY — CERCLAGE, CERVIX, VAGINAL APPROACH
Anesthesia: Choice

## 2016-04-30 ENCOUNTER — Inpatient Hospital Stay (HOSPITAL_COMMUNITY): Payer: No Typology Code available for payment source | Admitting: Anesthesiology

## 2016-04-30 ENCOUNTER — Inpatient Hospital Stay (HOSPITAL_COMMUNITY)
Admission: AD | Admit: 2016-04-30 | Discharge: 2016-05-02 | DRG: 770 | Disposition: A | Discharge status: Home / Self Care | Payer: No Typology Code available for payment source | Source: Ambulatory Visit | Attending: Obstetrics and Gynecology | Admitting: Obstetrics and Gynecology

## 2016-04-30 ENCOUNTER — Encounter (HOSPITAL_COMMUNITY): Payer: Self-pay | Admitting: *Deleted

## 2016-04-30 ENCOUNTER — Encounter (HOSPITAL_COMMUNITY)
Admission: AD | Disposition: A | Discharge status: Home / Self Care | Payer: Self-pay | Source: Ambulatory Visit | Attending: Obstetrics and Gynecology

## 2016-04-30 DIAGNOSIS — O021 Missed abortion: Principal | ICD-10-CM | POA: Diagnosis present

## 2016-04-30 DIAGNOSIS — Z3A15 15 weeks gestation of pregnancy: Secondary | ICD-10-CM | POA: Diagnosis not present

## 2016-04-30 DIAGNOSIS — O42912 Preterm premature rupture of membranes, unspecified as to length of time between rupture and onset of labor, second trimester: Secondary | ICD-10-CM | POA: Diagnosis present

## 2016-04-30 DIAGNOSIS — O411220 Chorioamnionitis, second trimester, not applicable or unspecified: Secondary | ICD-10-CM | POA: Diagnosis present

## 2016-04-30 HISTORY — PX: DILATION AND EVACUATION: SHX1459

## 2016-04-30 LAB — GLUCOSE, CAPILLARY: GLUCOSE-CAPILLARY: 145 mg/dL — AB (ref 65–99)

## 2016-04-30 LAB — TYPE AND SCREEN
ABO/RH(D): O POS
Antibody Screen: NEGATIVE

## 2016-04-30 LAB — LACTIC ACID, PLASMA
LACTIC ACID, VENOUS: 1.3 mmol/L (ref 0.5–1.9)
LACTIC ACID, VENOUS: 3.1 mmol/L — AB (ref 0.5–1.9)

## 2016-04-30 LAB — CBC
HEMATOCRIT: 41.2 % (ref 36.0–46.0)
Hemoglobin: 14.1 g/dL (ref 12.0–15.0)
MCH: 27.5 pg (ref 26.0–34.0)
MCHC: 34.2 g/dL (ref 30.0–36.0)
MCV: 80.3 fL (ref 78.0–100.0)
PLATELETS: 231 10*3/uL (ref 150–400)
RBC: 5.13 MIL/uL — ABNORMAL HIGH (ref 3.87–5.11)
RDW: 14 % (ref 11.5–15.5)
WBC: 16.9 10*3/uL — AB (ref 4.0–10.5)

## 2016-04-30 SURGERY — DILATION AND EVACUATION, UTERUS
Anesthesia: Spinal

## 2016-04-30 MED ORDER — SCOPOLAMINE 1 MG/3DAYS TD PT72
MEDICATED_PATCH | TRANSDERMAL | Status: AC
  Administered 2016-04-30: 1.5 mg via TRANSDERMAL
  Filled 2016-04-30: qty 1

## 2016-04-30 MED ORDER — ACETAMINOPHEN 325 MG PO TABS: 650.0000 mg | ORAL_TABLET | ORAL | Status: DC | PRN

## 2016-04-30 MED ORDER — SODIUM CHLORIDE 0.9 % IV SOLN
2.0000 g | Freq: Four times a day (QID) | INTRAVENOUS | Status: DC
  Administered 2016-04-30 – 2016-05-02 (×7): 2 g via INTRAVENOUS
  Filled 2016-04-30 (×9): qty 2000

## 2016-04-30 MED ORDER — DIPHENHYDRAMINE HCL 25 MG PO CAPS: 25.0000 mg | ORAL_CAPSULE | Freq: Four times a day (QID) | ORAL | Status: DC | PRN

## 2016-04-30 MED ORDER — LACTATED RINGERS IV SOLN: INTRAVENOUS | Status: DC

## 2016-04-30 MED ORDER — TETANUS-DIPHTH-ACELL PERTUSSIS 5-2.5-18.5 LF-MCG/0.5 IM SUSP: 0.5000 mL | Freq: Once | INTRAMUSCULAR | Status: DC

## 2016-04-30 MED ORDER — FENTANYL CITRATE (PF) 100 MCG/2ML IJ SOLN
INTRAMUSCULAR | Status: AC
  Filled 2016-04-30: qty 2

## 2016-04-30 MED ORDER — OXYTOCIN 40 UNITS IN LACTATED RINGERS INFUSION - SIMPLE MED: 2.5000 [IU]/h | INTRAVENOUS | Status: DC

## 2016-04-30 MED ORDER — MIDAZOLAM HCL 2 MG/2ML IJ SOLN
INTRAMUSCULAR | Status: AC
  Filled 2016-04-30: qty 2

## 2016-04-30 MED ORDER — ONDANSETRON HCL 4 MG PO TABS: 4.0000 mg | ORAL_TABLET | ORAL | Status: DC | PRN

## 2016-04-30 MED ORDER — SIMETHICONE 80 MG PO CHEW: 80.0000 mg | CHEWABLE_TABLET | ORAL | Status: DC | PRN

## 2016-04-30 MED ORDER — ZOLPIDEM TARTRATE 5 MG PO TABS: 5.0000 mg | ORAL_TABLET | Freq: Every evening | ORAL | Status: DC | PRN

## 2016-04-30 MED ORDER — OXYCODONE-ACETAMINOPHEN 5-325 MG PO TABS: 1.0000 | ORAL_TABLET | ORAL | Status: DC | PRN

## 2016-04-30 MED ORDER — DIBUCAINE 1 % RE OINT: 1.0000 "application " | TOPICAL_OINTMENT | RECTAL | Status: DC | PRN

## 2016-04-30 MED ORDER — METHYLERGONOVINE MALEATE 0.2 MG PO TABS
0.2000 mg | ORAL_TABLET | Freq: Three times a day (TID) | ORAL | Status: DC
  Administered 2016-05-01 – 2016-05-02 (×5): 0.2 mg via ORAL
  Filled 2016-04-30 (×5): qty 1

## 2016-04-30 MED ORDER — KETOROLAC TROMETHAMINE 30 MG/ML IJ SOLN
30.0000 mg | Freq: Once | INTRAMUSCULAR | Status: AC
  Administered 2016-04-30: 30 mg via INTRAVENOUS

## 2016-04-30 MED ORDER — SENNOSIDES-DOCUSATE SODIUM 8.6-50 MG PO TABS
2.0000 | ORAL_TABLET | ORAL | Status: DC
  Administered 2016-05-01 – 2016-05-02 (×2): 2 via ORAL
  Filled 2016-04-30 (×2): qty 2

## 2016-04-30 MED ORDER — WITCH HAZEL-GLYCERIN EX PADS: 1.0000 "application " | MEDICATED_PAD | CUTANEOUS | Status: DC | PRN

## 2016-04-30 MED ORDER — GENTAMICIN SULFATE 40 MG/ML IJ SOLN: 1.5000 mg/kg | Freq: Three times a day (TID) | INTRAVENOUS | Status: DC

## 2016-04-30 MED ORDER — MISOPROSTOL 200 MCG PO TABS
ORAL_TABLET | ORAL | Status: AC
  Filled 2016-04-30: qty 1

## 2016-04-30 MED ORDER — MIDAZOLAM HCL 5 MG/5ML IJ SOLN
INTRAMUSCULAR | Status: DC | PRN
Start: 2016-04-30 — End: 2016-04-30
  Administered 2016-04-30 (×2): 1 mg via INTRAVENOUS
  Administered 2016-04-30: 2 mg via INTRAVENOUS

## 2016-04-30 MED ORDER — ONDANSETRON HCL 4 MG/2ML IJ SOLN
INTRAMUSCULAR | Status: AC
  Filled 2016-04-30: qty 2

## 2016-04-30 MED ORDER — PHENYLEPHRINE 40 MCG/ML (10ML) SYRINGE FOR IV PUSH (FOR BLOOD PRESSURE SUPPORT)
PREFILLED_SYRINGE | INTRAVENOUS | Status: AC
  Filled 2016-04-30: qty 10

## 2016-04-30 MED ORDER — MEASLES, MUMPS & RUBELLA VAC ~~LOC~~ INJ
0.5000 mL | INJECTION | Freq: Once | SUBCUTANEOUS | Status: DC
  Filled 2016-04-30: qty 0.5

## 2016-04-30 MED ORDER — SODIUM CHLORIDE 0.9 % IV SOLN: 1.0000 g | Freq: Four times a day (QID) | INTRAVENOUS | Status: DC

## 2016-04-30 MED ORDER — PHENYLEPHRINE HCL 10 MG/ML IJ SOLN
INTRAMUSCULAR | Status: DC | PRN
  Administered 2016-04-30 (×5): 80 ug via INTRAVENOUS

## 2016-04-30 MED ORDER — FENTANYL CITRATE (PF) 100 MCG/2ML IJ SOLN
INTRAMUSCULAR | Status: DC | PRN
  Administered 2016-04-30: 100 ug via INTRAVENOUS

## 2016-04-30 MED ORDER — KETOROLAC TROMETHAMINE 30 MG/ML IJ SOLN
INTRAMUSCULAR | Status: AC
  Administered 2016-04-30: 30 mg via INTRAVENOUS
  Filled 2016-04-30: qty 1

## 2016-04-30 MED ORDER — BISACODYL 10 MG RE SUPP: 10.0000 mg | Freq: Every day | RECTAL | Status: DC | PRN

## 2016-04-30 MED ORDER — MEDROXYPROGESTERONE ACETATE 150 MG/ML IM SUSP: 150.0000 mg | INTRAMUSCULAR | Status: DC | PRN

## 2016-04-30 MED ORDER — OXYCODONE-ACETAMINOPHEN 5-325 MG PO TABS: 2.0000 | ORAL_TABLET | ORAL | Status: DC | PRN

## 2016-04-30 MED ORDER — OXYTOCIN BOLUS FROM INFUSION: 500.0000 mL | Freq: Once | INTRAVENOUS | Status: DC

## 2016-04-30 MED ORDER — ONDANSETRON HCL 4 MG/2ML IJ SOLN
4.0000 mg | Freq: Four times a day (QID) | INTRAMUSCULAR | Status: DC | PRN
  Administered 2016-04-30 (×2): 4 mg via INTRAVENOUS
  Filled 2016-04-30: qty 2

## 2016-04-30 MED ORDER — LACTATED RINGERS IV SOLN
INTRAVENOUS | Status: DC | PRN
  Administered 2016-04-30 (×2): via INTRAVENOUS

## 2016-04-30 MED ORDER — SOD CITRATE-CITRIC ACID 500-334 MG/5ML PO SOLN
30.0000 mL | ORAL | Status: DC | PRN
  Administered 2016-04-30: 30 mL via ORAL
  Filled 2016-04-30: qty 15

## 2016-04-30 MED ORDER — LIDOCAINE HCL (PF) 1 % IJ SOLN: 30.0000 mL | INTRAMUSCULAR | Status: DC | PRN

## 2016-04-30 MED ORDER — MISOPROSTOL 200 MCG PO TABS
200.0000 ug | ORAL_TABLET | Freq: Once | ORAL | Status: AC
Start: 2016-04-30 — End: 2016-04-30
  Administered 2016-04-30: 200 ug via VAGINAL

## 2016-04-30 MED ORDER — LACTATED RINGERS IV SOLN: 500.0000 mL | INTRAVENOUS | Status: DC | PRN

## 2016-04-30 MED ORDER — ACETAMINOPHEN 10 MG/ML IV SOLN
1000.0000 mg | Freq: Once | INTRAVENOUS | Status: AC
  Administered 2016-04-30: 1000 mg via INTRAVENOUS
  Filled 2016-04-30: qty 100

## 2016-04-30 MED ORDER — ONDANSETRON HCL 4 MG/2ML IJ SOLN: 4.0000 mg | INTRAMUSCULAR | Status: DC | PRN

## 2016-04-30 MED ORDER — SCOPOLAMINE 1 MG/3DAYS TD PT72
1.0000 | MEDICATED_PATCH | TRANSDERMAL | Status: DC
  Administered 2016-04-30: 1.5 mg via TRANSDERMAL

## 2016-04-30 MED ORDER — BUTORPHANOL TARTRATE 1 MG/ML IJ SOLN
2.0000 mg | Freq: Once | INTRAMUSCULAR | Status: AC
  Administered 2016-04-30: 2 mg via INTRAVENOUS
  Filled 2016-04-30: qty 2

## 2016-04-30 MED ORDER — FLEET ENEMA 7-19 GM/118ML RE ENEM: 1.0000 | ENEMA | Freq: Every day | RECTAL | Status: DC | PRN

## 2016-04-30 MED ORDER — IBUPROFEN 600 MG PO TABS
600.0000 mg | ORAL_TABLET | Freq: Four times a day (QID) | ORAL | Status: DC
  Administered 2016-05-01 – 2016-05-02 (×5): 600 mg via ORAL
  Filled 2016-04-30 (×5): qty 1

## 2016-04-30 MED ORDER — METHYLERGONOVINE MALEATE 0.2 MG/ML IJ SOLN
INTRAMUSCULAR | Status: DC | PRN
  Administered 2016-04-30: 0.2 mg via INTRAMUSCULAR

## 2016-04-30 MED ORDER — CLINDAMYCIN PHOSPHATE 900 MG/50ML IV SOLN: 900.0000 mg | Freq: Three times a day (TID) | INTRAVENOUS | Status: DC

## 2016-04-30 MED ORDER — GENTAMICIN SULFATE 40 MG/ML IJ SOLN
Freq: Three times a day (TID) | INTRAVENOUS | Status: DC
  Administered 2016-04-30 – 2016-05-02 (×6): via INTRAVENOUS
  Filled 2016-04-30 (×6): qty 4

## 2016-04-30 MED ORDER — BENZOCAINE-MENTHOL 20-0.5 % EX AERO: 1.0000 "application " | INHALATION_SPRAY | CUTANEOUS | Status: DC | PRN

## 2016-04-30 SURGICAL SUPPLY — 19 items
CATH ROBINSON RED A/P 16FR (CATHETERS) ×3 IMPLANT
CLOTH BEACON ORANGE TIMEOUT ST (SAFETY) ×3 IMPLANT
DECANTER SPIKE VIAL GLASS SM (MISCELLANEOUS) ×3 IMPLANT
GLOVE BIO SURGEON STRL SZ 6.5 (GLOVE) ×4 IMPLANT
GLOVE BIO SURGEONS STRL SZ 6.5 (GLOVE) ×2
GLOVE BIOGEL PI IND STRL 7.0 (GLOVE) ×1 IMPLANT
GLOVE BIOGEL PI INDICATOR 7.0 (GLOVE) ×2
GOWN STRL REUS W/TWL LRG LVL3 (GOWN DISPOSABLE) ×6 IMPLANT
KIT BERKELEY 1ST TRIMESTER 3/8 (MISCELLANEOUS) ×3 IMPLANT
NS IRRIG 1000ML POUR BTL (IV SOLUTION) ×3 IMPLANT
PACK VAGINAL MINOR WOMEN LF (CUSTOM PROCEDURE TRAY) ×3 IMPLANT
PAD OB MATERNITY 4.3X12.25 (PERSONAL CARE ITEMS) ×3 IMPLANT
PAD PREP 24X48 CUFFED NSTRL (MISCELLANEOUS) ×3 IMPLANT
SET BERKELEY SUCTION TUBING (SUCTIONS) ×3 IMPLANT
TOWEL OR 17X24 6PK STRL BLUE (TOWEL DISPOSABLE) ×6 IMPLANT
VACURETTE 10 RIGID CVD (CANNULA) IMPLANT
VACURETTE 7MM CVD STRL WRAP (CANNULA) IMPLANT
VACURETTE 8 RIGID CVD (CANNULA) IMPLANT
VACURETTE 9 RIGID CVD (CANNULA) IMPLANT

## 2016-04-30 NOTE — Anesthesia Procedure Notes (Signed)
Spinal  Patient location during procedure: OR Staffing Anesthesiologist: Lyndle Herrlich Preanesthetic Checklist Completed: patient identified, site marked, surgical consent, pre-op evaluation, timeout performed, IV checked, risks and benefits discussed and monitors and equipment checked Spinal Block Patient position: sitting Prep: DuraPrep Patient monitoring: heart rate, cardiac monitor, continuous pulse ox and blood pressure Approach: midline Location: L3-4 Injection technique: single-shot Needle Needle type: Sprotte  Needle gauge: 24 G Needle length: 9 cm Assessment Sensory level: T10 Additional Notes Spinal Dosage in OR  Bupivicaine ml       1.2

## 2016-04-30 NOTE — Anesthesia Preprocedure Evaluation (Signed)
Anesthesia Evaluation  Patient identified by MRN, date of birth, ID band Patient awake    Reviewed: Allergy & Precautions, H&P , Patient's Chart, lab work & pertinent test results  Airway Mallampati: II  TM Distance: >3 FB Neck ROM: full    Dental no notable dental hx.    Pulmonary    Pulmonary exam normal breath sounds clear to auscultation       Cardiovascular Exercise Tolerance: Good  Rhythm:regular Rate:Normal     Neuro/Psych    GI/Hepatic   Endo/Other    Renal/GU      Musculoskeletal   Abdominal   Peds  Hematology   Anesthesia Other Findings   Reproductive/Obstetrics                             Anesthesia Physical Anesthesia Plan  ASA: III and emergent  Anesthesia Plan: Spinal   Post-op Pain Management:    Induction:   Airway Management Planned:   Additional Equipment:   Intra-op Plan:   Post-operative Plan:   Informed Consent: I have reviewed the patients History and Physical, chart, labs and discussed the procedure including the risks, benefits and alternatives for the proposed anesthesia with the patient or authorized representative who has indicated his/her understanding and acceptance.     Plan Discussed with:   Anesthesia Plan Comments: (  )        Anesthesia Quick Evaluation

## 2016-04-30 NOTE — Transfer of Care (Signed)
Immediate Anesthesia Transfer of Care Note  Patient: [name]  Procedure(s) Performed: Procedure(s): DILATATION AND EVACUATION (N/A)  Patient Location: PACU  Anesthesia Type:Spinal  Level of Consciousness: awake, alert  and oriented  Airway & Oxygen Therapy: Patient Spontanous Breathing  Post-op Assessment: Report given to RN and Post -op Vital signs reviewed and stable  Post vital signs: Reviewed and stable  Last Vitals:  Vitals:   04/30/16 1831 04/30/16 1901  BP: 118/75 118/76  Pulse: 93 97  Resp:    Temp:      Last Pain:  Vitals:   04/30/16 1750  TempSrc: Axillary         Complications: No apparent anesthesia complications

## 2016-04-30 NOTE — Progress Notes (Signed)
I spent time with Chauncey and Thurmond Butts to offer emotional and spiritual support.  This is their 5th loss and she has been at home on bedrest for almost 3 weeks after her water broke, as she said, "just waiting for something to go wrong."  They have good family support and have the support of their church community as well as the [employer].  At this time they don't want to see their baby, but they are open to the possibility depending on how baby looks.  I let them know of our availability during the process and after.  Please page as needs arise or as family requests.  [place], [place] Pager, [phone] 4:57 PM    04/30/16 1600  Clinical Encounter Type  Visited With Patient and family together  Visit Type Spiritual support  Referral From Nurse

## 2016-04-30 NOTE — Op Note (Signed)
[name], [name]                ACCOUNT NO.:  [id number]  MEDICAL RECORD NO.:  [id number]  LOCATION:                                 FACILITY:  PHYSICIAN:  Keyshia Orwick L. Helane Rima, M.D.    DATE OF BIRTH:  DATE OF PROCEDURE:  04/30/2016 DATE OF DISCHARGE:                              OPERATIVE REPORT   PREOPERATIVE DIAGNOSIS:  Delivery of intrauterine fetal demise at 9 weeks secondary to chorioamnionitis and retained placenta.  POSTOPERATIVE DIAGNOSIS:  Delivery of intrauterine fetal demise at 15 weeks secondary to chorioamnionitis and retained placenta.  PROCEDURE:  Uterine curettage.  SURGEON:  Zamariah Seaborn L. Helane Rima, M.D.  ESTIMATED BLOOD LOSS:  200 mL.  COMPLICATIONS:  None.  DESCRIPTION OF PROCEDURE:  The patient was taken to the operating room. She was administered a spinal.  She was placed in the supine position. She was then prepped and draped.  An in-and-out catheter was used to empty the bladder.  A speculum was inserted to the vagina and the cervix was grasped with a tenaculum and I could see the umbilical cord through the cervix and a large amount of clot.  I then inserted a __________ curette and curetted the uterus several times with retrieval of placental tissue.  The placental tissue was sent to Pathology.  I then curetted the uterus until I felt all 4 walls were plain and felt gritty. Minimal bleeding was noted.  Methergine was given.  She had already been on antibiotics.  __________ sponge, lap, instrument counts were correct x2.     Michael Ventresca L. Helane Rima, M.D.     Nevin Bloodgood  D:  04/30/2016  T:  04/30/2016  Job:  [id number]

## 2016-04-30 NOTE — Brief Op Note (Signed)
04/30/2016  8:36 PM  PATIENT:  Lorine Bears  36 y.o. female  PRE-OPERATIVE DIAGNOSIS:  15 week IUFD/ Retained Placenta  POST-OPERATIVE DIAGNOSIS:  15 week IUFD/ Retained Placenta  PROCEDURE:   Uterine Curettage  SURGEON:  Surgeon(s) and Role:    * Dian Queen, MD - Primary  PHYSICIAN ASSISTANT:   ASSISTANTS: none   ANESTHESIA:   spinal  EBL:  Total I/O In: 1000 [I.V.:1000] Out: 250 [Urine:50; Blood:200]  BLOOD ADMINISTERED:none  DRAINS: none   LOCAL MEDICATIONS USED:  NONE  SPECIMEN:  Source of Specimen:  placenta  DISPOSITION OF SPECIMEN:  Source of Specimen:  placenta  COUNTS:  YES  TOURNIQUET:  * No tourniquets in log *  DICTATION: .Other Dictation: Dictation Number [id number]  PLAN OF CARE: Admit to inpatient   PATIENT DISPOSITION:  PACU - hemodynamically stable.   Delay start of Pharmacological VTE agent (>24hrs) due to surgical blood loss or risk of bleeding: yes

## 2016-04-30 NOTE — Progress Notes (Signed)
Patient feels cramping Placenta still undelivered  Will proceed with uterine curettage because of chorioamnionitis

## 2016-04-30 NOTE — H&P (Signed)
[name] is a 36 y.o. G 4 P 0 at 15 weeks with pregnancy complicated by PPROM presented with yellow discharge and cramping today. On exam, yellow discharge and abdominal cramping today. Cervix in office dilated to 3 cm and cord prolapsing through membranes. No heart rate in office. OB History    Gravida Para Term Preterm AB Living   4 1   1 2      SAB TAB Ectopic Multiple Live Births   2     1 2      Past Medical History:  Diagnosis Date  . Anemia    history with pregnancy  . Complication of anesthesia    difficult time waking up after gallbladder surgery, blood pressure drop with C section after babies delivered  . GERD (gastroesophageal reflux disease)   . PCOS (polycystic ovarian syndrome)   . PONV (postoperative nausea and vomiting)   . Seasonal allergies    Past Surgical History:  Procedure Laterality Date  . CERVICAL CERCLAGE    . CESAREAN SECTION    . CHOLECYSTECTOMY     Family History: family history is not on file. Social History:  reports that she has never smoked. She has never used smokeless tobacco. She reports that she drinks alcohol. She reports that she does not use drugs.     Maternal Diabetes: No Genetic Screening: Normal Maternal Ultrasounds/Referrals: Normal Fetal Ultrasounds or other Referrals:  None Maternal Substance Abuse:  No Significant Maternal Medications:  None Significant Maternal Lab Results:  None Other Comments:  None  Review of Systems  All other systems reviewed and are negative.  History   Blood pressure 128/80, pulse (!) 109, temperature 99.7 F (37.6 C), temperature source Axillary, resp. rate 18, height 5\' 6"  (1.676 m), weight 104.8 kg (231 lb), last menstrual period 01/02/2016. Maternal Exam:  Introitus: Vagina is positive for vaginal discharge.    Physical Exam  Nursing note and vitals reviewed. Constitutional: She appears well-developed.  HENT:  Head: Normocephalic.  Eyes: Pupils are equal, round, and reactive to  light.  Neck: Normal range of motion.  Cardiovascular: Normal rate and regular rhythm.   GI: There is tenderness.  Genitourinary: Vaginal discharge found.  Yellow malodorous discharge  Prenatal labs: ABO, Rh: --/--/O POS (03/20 1530) Antibody: PENDING (03/20 1530) Rubella: Nonimmune (02/06 0000) RPR: Nonreactive (02/06 0000)  HBsAg: Negative (02/06 0000)  HIV: Non-reactive (02/06 0000)  GBS: Negative (02/06 0000)   Assessment/Plan: IUP at 15 weeks Chorioamnionitis IUFD PPROM since 3/5 Patient given IV AMP/Gent/Clindamycin Patient delivered fetus spontaneously shortly after arrival to L and D  Thurston Brendlinger L 04/30/2016, 5:02 PM

## 2016-05-01 ENCOUNTER — Encounter (HOSPITAL_COMMUNITY): Payer: Self-pay | Admitting: Obstetrics and Gynecology

## 2016-05-01 LAB — CBC
HEMATOCRIT: 35.6 % — AB (ref 36.0–46.0)
Hemoglobin: 12.1 g/dL (ref 12.0–15.0)
MCH: 27.4 pg (ref 26.0–34.0)
MCHC: 34 g/dL (ref 30.0–36.0)
MCV: 80.7 fL (ref 78.0–100.0)
PLATELETS: 208 10*3/uL (ref 150–400)
RBC: 4.41 MIL/uL (ref 3.87–5.11)
RDW: 14.3 % (ref 11.5–15.5)
WBC: 12.4 10*3/uL — AB (ref 4.0–10.5)

## 2016-05-01 LAB — RPR: RPR Ser Ql: NONREACTIVE

## 2016-05-01 LAB — RUBELLA SCREEN: Rubella: <0.9 index — ABNORMAL LOW (ref >0.99)

## 2016-05-01 LAB — LACTIC ACID, PLASMA: Lactic Acid, Venous: 4.1 mmol/L (ref 0.5–1.9)

## 2016-05-01 NOTE — Lactation Note (Signed)
Lactation Consultation Note  Patient Name: [name] [id number] Date: 05/01/2016   Mom given lactation and loss brochure and a hand pump with review. Enc mom to use cabbage leaves as needed and mom aware of [place] assistance by phone after D/C.   Maternal Data    Feeding    LATCH Score/Interventions                      Lactation Tools Discussed/Used     Consult Status      [name] 05/01/2016, 9:41 AM

## 2016-05-01 NOTE — Anesthesia Postprocedure Evaluation (Signed)
Anesthesia Post Note  Patient: [name]  Procedure(s) Performed: Procedure(s) (LRB): DILATATION AND EVACUATION (N/A)  Patient location during evaluation: PACU Anesthesia Type: Spinal Level of consciousness: awake Pain management: satisfactory to patient Vital Signs Assessment: post-procedure vital signs reviewed and stable Respiratory status: spontaneous breathing Cardiovascular status: blood pressure returned to baseline Postop Assessment: no headache and spinal receding Anesthetic complications: no        Last Vitals:  Vitals:   04/30/16 2327 05/01/16 0434  BP: 112/71 (!) 98/54  Pulse: 91 79  Resp: 20 18  Temp: 36.9 C 37.1 C    Last Pain:  Vitals:   05/01/16 0518  TempSrc:   PainSc: Asleep   Pain Goal: Patients Stated Pain Goal: 3 (05/01/16 0410)               Riccardo Dubin

## 2016-05-01 NOTE — Progress Notes (Signed)
I offered follow up support this morning after the birth of their baby, [name].  They are coping as well as can be expected and have good resources for support.  They are aware of our ongoing availability to offer support to them and to their family members.  [place], [place] Pager, [phone] 11:02 AM    05/01/16 1100  Clinical Encounter Type  Visited With Patient and family together  Visit Type Spiritual support

## 2016-05-01 NOTE — Progress Notes (Signed)
Pt hasnt eaten all day blood glucose checked still c/o head heaviness and chin numb   Continue to monitor.

## 2016-05-01 NOTE — Progress Notes (Signed)
Post Partum Day 1 Subjective: no complaints, up ad lib, voiding, tolerating PO and emotionally stable  Objective: Blood pressure (!) 106/59, pulse 83, temperature 98.1 F (36.7 C), temperature source Oral, resp. rate 18, height 5\' 6"  (1.676 m), weight 231 lb (104.8 kg), last menstrual period 01/02/2016, SpO2 100 %.  Physical Exam:  General: alert, cooperative and flushed Lochia: appropriate Uterine Fundus: firm Incision: perineum intact DVT Evaluation: No cords or calf tenderness. No significant calf/ankle edema.   Recent Labs  04/30/16 1530 05/01/16 0532  HGB 14.1 12.1  HCT 41.2 35.6*    Assessment/Plan: Continue IV ATB's for 24 hours. Repeat Lactic acid  LOS: 1 day   CURTIS,CAROL G 05/01/2016, 8:25 AM

## 2016-05-01 NOTE — Progress Notes (Signed)
CRITICAL VALUE ALERT  Critical value received:  Lactic Acid 4.1   Date of notification:  05/01/2016    Time of notification:  1118  Critical value read back:Yes.    Nurse who received alert:  A. Araceli Bouche [facility]   MD notified (1st page):  D. Corinna Capra   Time of first page:  Phoned at 11:16  MD notified (2nd page):  Time of second page:  Responding MD:  Corinna Capra  Time MD responded:  Answered phone call at 11:16  No new orders at this time. MD to come assess pt. Most recent VS were reviewed with MD at this time.

## 2016-05-01 NOTE — Progress Notes (Signed)
Pt feeling better had a granola bar to eat tolerated well.  Denies head heaviness and chin numbness.

## 2016-05-01 NOTE — Progress Notes (Signed)
Pt c/o of her head feeling heavy and her chin feeling numb. VSS, will continue to monitor pt.

## 2016-05-02 LAB — CBC WITH DIFFERENTIAL/PLATELET
BASOS PCT: 1 %
Basophils Absolute: 0 10*3/uL (ref 0.0–0.1)
EOS ABS: 0.4 10*3/uL (ref 0.0–0.7)
Eosinophils Relative: 5 %
HCT: 35 % — ABNORMAL LOW (ref 36.0–46.0)
HEMOGLOBIN: 11.7 g/dL — AB (ref 12.0–15.0)
LYMPHS ABS: 2.9 10*3/uL (ref 0.7–4.0)
Lymphocytes Relative: 33 %
MCH: 27.7 pg (ref 26.0–34.0)
MCHC: 33.4 g/dL (ref 30.0–36.0)
MCV: 82.7 fL (ref 78.0–100.0)
MONOS PCT: 5 %
Monocytes Absolute: 0.4 10*3/uL (ref 0.1–1.0)
NEUTROS PCT: 56 %
Neutro Abs: 4.9 10*3/uL (ref 1.7–7.7)
PLATELETS: 181 10*3/uL (ref 150–400)
RBC: 4.23 MIL/uL (ref 3.87–5.11)
RDW: 14.5 % (ref 11.5–15.5)
WBC: 8.7 10*3/uL (ref 4.0–10.5)

## 2016-05-02 LAB — COMPREHENSIVE METABOLIC PANEL
ALT: 14 U/L (ref 14–54)
AST: 17 U/L (ref 15–41)
Albumin: 2.7 g/dL — ABNORMAL LOW (ref 3.5–5.0)
Alkaline Phosphatase: 51 U/L (ref 38–126)
Anion gap: 8 (ref 5–15)
BUN: 6 mg/dL (ref 6–20)
CHLORIDE: 108 mmol/L (ref 101–111)
CO2: 23 mmol/L (ref 22–32)
Calcium: 8.6 mg/dL — ABNORMAL LOW (ref 8.9–10.3)
Creatinine, Ser: 0.7 mg/dL (ref 0.44–1.00)
GFR calc non Af Amer: >60 mL/min (ref >60)
GLUCOSE: 111 mg/dL — AB (ref 65–99)
POTASSIUM: 4 mmol/L (ref 3.5–5.1)
Sodium: 139 mmol/L (ref 135–145)
Total Bilirubin: 0.5 mg/dL (ref 0.3–1.2)
Total Protein: 5.6 g/dL — ABNORMAL LOW (ref 6.5–8.1)

## 2016-05-02 LAB — LACTIC ACID, PLASMA: Lactic Acid, Venous: 1.9 mmol/L (ref 0.5–1.9)

## 2016-05-02 MED ORDER — IBUPROFEN 600 MG PO TABS: 600.0000 mg | ORAL_TABLET | Freq: Four times a day (QID) | ORAL | 0 refills | Status: DC | PRN

## 2016-05-02 MED ORDER — AMOXICILLIN-POT CLAVULANATE 875-125 MG PO TABS: 1.0000 | ORAL_TABLET | Freq: Two times a day (BID) | ORAL | 0 refills | Status: DC

## 2016-05-02 NOTE — Discharge Summary (Signed)
Obstetric Discharge Summary Reason for Admission: induction of labor Prenatal Procedures: ultrasound Intrapartum Procedures: spontaneous vaginal delivery Postpartum Procedures: retained placenta Complications-Operative and Postpartum: elevated lactic acid Hemoglobin  Date Value Ref Range Status  05/02/2016 11.7 (L) 12.0 - 15.0 g/dL Final   HCT  Date Value Ref Range Status  05/02/2016 35.0 (L) 36.0 - 46.0 % Final    Physical Exam:  General: alert and cooperative Lochia: appropriate Uterine Fundus: firm Incision: n/a DVT Evaluation: No evidence of DVT seen on physical exam.  Discharge Diagnoses: IUFD @ 15 weeks, elevated lactic acid  Discharge Information: Date: 05/02/2016 Activity: pelvic rest Diet: routine Medications: PNV, Ibuprofen and augmentin Condition: stable Discharge to: home Follow-up [place], MD. Schedule an appointment as soon as possible for a visit in 6 day(s).   Specialty:  Obstetrics and Gynecology Contact information: [place] [address] [phone]        Normand Sloop, MD .   Specialty:  Orthopedic Surgery Contact information: [place] [address] [phone]           Newborn Data: female  Birth Weight:   APGAR: 0, 0    [name] 05/02/2016, 8:29 AM

## 2016-05-02 NOTE — Progress Notes (Signed)
Pt. Is discharged in the care of husband,R.N. Escort. Denies any pain or discomfort. Spirts are good Emotional support given to lost. Verbalized fears about lost.Understands fears about lost.Also understands all discharged instructions about lost. Questions asked and answered.

## 2016-08-23 NOTE — Anesthesia Postprocedure Evaluation (Signed)
Anesthesia Post Note  Patient: [name]  Procedure(s) Performed: Procedure(s) (LRB): DILATATION AND EVACUATION (N/A)     Anesthesia Post Evaluation  Last Vitals:  Vitals:   05/02/16 0422 05/02/16 0846  BP: (!) 111/54 112/74  Pulse: 78 73  Resp: 18 18  Temp: 36.6 C 36.6 C    Last Pain:  Vitals:   05/02/16 0846  TempSrc: Oral  PainSc:                  Riccardo Dubin

## 2016-08-23 NOTE — Addendum Note (Signed)
Addendum  created 08/23/16 1413 by Lyndle Herrlich, MD   Sign clinical note

## 2017-09-22 ENCOUNTER — Encounter: Payer: Self-pay | Admitting: Physician Assistant

## 2017-10-30 ENCOUNTER — Encounter: Payer: Self-pay | Admitting: Physician Assistant

## 2018-02-17 ENCOUNTER — Encounter: Payer: Self-pay | Admitting: Physician Assistant

## 2018-10-06 NOTE — H&P (Signed)
38 year old female presents for TAH, BSO for abnormal uterine bleeding and pelvic pain.   She has history of ovarian cysts and PCOS  Recent ultrasound - complex cyst on left ovary - probable endometrioma  Past Medical History:  Diagnosis Date  . Anemia    history with pregnancy  . Complication of anesthesia    difficult time waking up after gallbladder surgery, blood pressure drop with C section after babies delivered  . GERD (gastroesophageal reflux disease)   . PCOS (polycystic ovarian syndrome)   . PONV (postoperative nausea and vomiting)   . Seasonal allergies    Past Surgical History:  Procedure Laterality Date  . CERVICAL CERCLAGE    . CESAREAN SECTION    . CHOLECYSTECTOMY    . DILATION AND EVACUATION N/A 04/30/2016   Procedure: DILATATION AND EVACUATION;  Surgeon: Dian Queen, MD;  Location: [facility];  Service: Gynecology;  Laterality: N/A;   Prior to Admission medications   Medication Sig Start Date End Date Taking? Authorizing Provider  amoxicillin-clavulanate (AUGMENTIN) 875-125 MG tablet Take 1 tablet by mouth 2 (two) times daily. 05/02/16   Marylynn Pearson, MD  calcium carbonate (TUMS - DOSED IN MG ELEMENTAL CALCIUM) 500 MG chewable tablet Chew 2 tablets by mouth 2 (two) times daily as needed for indigestion or heartburn.    [provider]  ibuprofen (ADVIL,MOTRIN) 600 MG tablet Take 1 tablet (600 mg total) by mouth every 6 (six) hours as needed. 05/02/16   Marylynn Pearson, MD  metFORMIN (GLUCOPHAGE) 500 MG tablet Take 500 mg by mouth 2 (two) times daily with a meal.    [provider]  Prenatal Vit-Fe Fumarate-FA (PRENATAL MULTIVITAMIN) TABS tablet Take 1 tablet by mouth at bedtime.     [provider]  pseudoephedrine (SUDAFED) 60 MG tablet Take 120 mg by mouth every 4 (four) hours as needed for congestion.    [provider]   Sulfa antibiotics No family history on file. Social History   Socioeconomic History  .  Marital status: Married    Spouse name: Not on file  . Number of children: Not on file  . Years of education: Not on file  . Highest education level: Not on file  Occupational History  . Not on file  Social Needs  . Financial resource strain: Not on file  . Food insecurity    Worry: Not on file    Inability: Not on file  . Transportation needs    Medical: Not on file    Non-medical: Not on file  Tobacco Use  . Smoking status: Never Smoker  . Smokeless tobacco: Never Used  Substance and Sexual Activity  . Alcohol use: Yes    Comment: none since pregnancy  . Drug use: No  . Sexual activity: Not Currently    Birth control/protection: None  Lifestyle  . Physical activity    Days per week: Not on file    Minutes per session: Not on file  . Stress: Not on file  Relationships  . Social Herbalist on phone: Not on file    Gets together: Not on file    Attends religious service: Not on file    Active member of club or organization: Not on file    Attends meetings of clubs or organizations: Not on file    Relationship status: Not on file  Other Topics Concern  . Not on file  Social History Narrative  . Not on file  General alert and oriented. Lung CTAB Car RRR Abdomen is soft and non tender  Pelvic fullness on left side and tender pelvis  IMPRESSION: Chronic pelvic pain PCOS Possible endometrioma on left ovary  PLAN: TAH and BSO Risks reviewed Consent signed

## 2018-10-13 NOTE — Pre-Procedure Instructions (Signed)
[name]  10/13/2018     Your procedure is scheduled on Tuesday, September 8.  Report to [facility], Main Entrance or Entrance "A" at 5:30 AM                    Your surgery or procedure is scheduled for  7:15   A.M.   Call this number if you have problems the morning of surgery: [phone]  This is the number for the Pre- Surgical Desk.                  For any other questions prior to surgery Monday - Friday, 8:00 AM - 4:00 PM, call [phone] PAT desk, ask to speak to any nurse.   Remember:  Do not eat  after midnight, Monday, September 7.   You may drink clear liquids until 4:30 AM .  Clear liquids allowed are  Water, Juice (non-citric and without pulp), Carbonated beverages, Clear Tea, Black Coffee only, Plain Jell-O only, Gatorade and Plain Popsicles only     Take these medicines the morning of surgery with A SIP OF WATER :                 1 Week prior to surgery STOP taking Aspirin, Aspirin Products (Goody Powder, Excedrin Migraine), Ibuprofen (Advil), Naproxen (Aleve), Vitamins and Herbal Products (ie Fish Oil). Special instructions:  [facility]- Preparing For Surgery  Before surgery, you can play an important role. Because skin is not sterile, your skin needs to be as free of germs as possible. You can reduce the number of germs on your skin by washing with CHG (chlorahexidine gluconate) Soap before surgery.  CHG is an antiseptic cleaner which kills germs and bonds with the skin to continue killing germs even after washing.    Oral Hygiene is also important to reduce your risk of infection.  Remember - BRUSH YOUR TEETH THE MORNING OF SURGERY WITH YOUR REGULAR TOOTHPASTE  Please do not use if you have an allergy to CHG or antibacterial soaps. If your skin becomes reddened/irritated stop using the CHG.  Do not shave (including legs and underarms) for at least 48 hours prior to first CHG shower. It is OK to shave your face.  Please follow these  instructions carefully.   1. Shower the NIGHT BEFORE SURGERY and the MORNING OF SURGERY with CHG.   2. If you chose to wash your hair, wash your hair first as usual with your normal shampoo.      After you shampoo,wash your face and private area with the soap you use at home, then rinse your hair and body thoroughly to remove the shampoo and soap.    Use CHG as you would any other liquid soap. You can apply CHG directly to the skin and wash gently with a scrungie or a clean washcloth.   3. Apply the CHG Soap to your body ONLY FROM THE NECK DOWN.  Do not use on open wounds or open sores. Avoid contact with your eyes, ears, mouth and genitals (private parts).  4. Wash thoroughly, paying special attention to the area where your surgery will be performed.  5. Thoroughly rinse your body with warm water from the neck down.  6. DO NOT shower/wash with your normal soap after using and rinsing off the CHG Soap.  7. Pat yourself dry with a CLEAN TOWEL.  8. Wear CLEAN PAJAMAS to bed the night before surgery, wear comfortable clothes  the morning of surgery  9. Place CLEAN SHEETS on your bed the night of your first shower and DO NOT SLEEP WITH PETS.  Day of Surgery: Shower as instructed above. Do not wear lotions, powders, or perfumes, or deodorant. Please wear clean clothes to the hospital/surgery center.   Remember to brush your teeth WITH YOUR REGULAR TOOTHPASTE Do not wear jewelry, make-up or nail polish.  Do not shave 48 hours prior to surgery.    Do not bring valuables to the hospital.  [facility] is not responsible for any belongings or valuables.  Contacts, dentures or bridgework may not be worn into surgery.  Leave your suitcase in the car.  After surgery it may be brought to your room.  For patients admitted to the hospital, discharge time will be determined by your treatment team.  Patients discharged the day of surgery will not be allowed to drive home.   Please read over the  following fact sheets that you were given Pain Booklet, Coughing and Deep Breathing, Surgical Site Infections.

## 2018-10-14 ENCOUNTER — Encounter (HOSPITAL_COMMUNITY): Payer: Self-pay

## 2018-10-14 ENCOUNTER — Other Ambulatory Visit: Payer: Self-pay

## 2018-10-14 ENCOUNTER — Encounter (HOSPITAL_COMMUNITY)
Admission: RE | Admit: 2018-10-14 | Discharge: 2018-10-14 | Disposition: A | Discharge status: Home / Self Care | Payer: PRIVATE HEALTH INSURANCE | Source: Ambulatory Visit | Attending: Obstetrics and Gynecology | Admitting: Obstetrics and Gynecology

## 2018-10-14 DIAGNOSIS — Z20828 Contact with and (suspected) exposure to other viral communicable diseases: Secondary | ICD-10-CM | POA: Insufficient documentation

## 2018-10-14 DIAGNOSIS — Z7984 Long term (current) use of oral hypoglycemic drugs: Secondary | ICD-10-CM | POA: Insufficient documentation

## 2018-10-14 DIAGNOSIS — Z01818 Encounter for other preprocedural examination: Secondary | ICD-10-CM | POA: Diagnosis present

## 2018-10-14 HISTORY — DX: Prediabetes: R73.03

## 2018-10-14 LAB — CBC
HCT: 48 % — ABNORMAL HIGH (ref 36.0–46.0)
Hemoglobin: 15.5 g/dL — ABNORMAL HIGH (ref 12.0–15.0)
MCH: 27.9 pg (ref 26.0–34.0)
MCHC: 32.3 g/dL (ref 30.0–36.0)
MCV: 86.3 fL (ref 80.0–100.0)
Platelets: 263 10*3/uL (ref 150–400)
RBC: 5.56 MIL/uL — ABNORMAL HIGH (ref 3.87–5.11)
RDW: 13.5 % (ref 11.5–15.5)
WBC: 10 10*3/uL (ref 4.0–10.5)
nRBC: 0 % (ref 0.0–0.2)

## 2018-10-14 LAB — BASIC METABOLIC PANEL
Anion gap: 13 (ref 5–15)
BUN: 11 mg/dL (ref 6–20)
CO2: 21 mmol/L — ABNORMAL LOW (ref 22–32)
Calcium: 9.5 mg/dL (ref 8.9–10.3)
Chloride: 103 mmol/L (ref 98–111)
Creatinine, Ser: 0.73 mg/dL (ref 0.44–1.00)
GFR calc Af Amer: >60 mL/min (ref >60)
GFR calc non Af Amer: >60 mL/min (ref >60)
Glucose, Bld: 106 mg/dL — ABNORMAL HIGH (ref 70–99)
Potassium: 3.9 mmol/L (ref 3.5–5.1)
Sodium: 137 mmol/L (ref 135–145)

## 2018-10-14 LAB — ABO/RH: ABO/RH(D): O POS

## 2018-10-14 LAB — TYPE AND SCREEN
ABO/RH(D): O POS
Antibody Screen: NEGATIVE

## 2018-10-14 LAB — HEMOGLOBIN A1C
Hgb A1c MFr Bld: 5.9 % — ABNORMAL HIGH (ref 4.8–5.6)
Mean Plasma Glucose: 122.63 mg/dL

## 2018-10-14 NOTE — Pre-Procedure Instructions (Addendum)
[name]  10/14/2018     Your procedure is scheduled on Tuesday, September 8.  Report to [facility], Main Entrance or Entrance "A" at 5:30 AM                    Your surgery or procedure is scheduled for  7:15   A.M.   Call this number if you have problems the morning of surgery: [phone]  This is the number for the Pre- Surgical Desk.                  For any other questions prior to surgery Monday - Friday, 8:00 AM - 4:00 PM, call [phone] PAT desk, ask to speak to any nurse.   Remember:  Do not eat  after midnight, Monday, September 7.   You may drink clear liquids until 4:30 AM .  Clear liquids allowed are  Water, Juice (non-citric and without pulp), Carbonated beverages, Clear Tea, Black Coffee only, Plain Jell-O only, Gatorade and Plain Popsicles only     Take these medicines the morning of surgery with A SIP OF WATER : Acetaminophen (Tylenol) if needed  NO METFORMIN the DAY OF SURGERY                 1 Week prior to surgery STOP taking Aspirin, Aspirin Products (Goody Powder, Excedrin Migraine), Ibuprofen (Advil), Naproxen (Aleve), Vitamins and Herbal Products (ie Fish Oil). Special instructions:  [facility]- Preparing For Surgery  Before surgery, you can play an important role. Because skin is not sterile, your skin needs to be as free of germs as possible. You can reduce the number of germs on your skin by washing with CHG (chlorahexidine gluconate) Soap before surgery.  CHG is an antiseptic cleaner which kills germs and bonds with the skin to continue killing germs even after washing.    Oral Hygiene is also important to reduce your risk of infection.  Remember - BRUSH YOUR TEETH THE MORNING OF SURGERY WITH YOUR REGULAR TOOTHPASTE  Please do not use if you have an allergy to CHG or antibacterial soaps. If your skin becomes reddened/irritated stop using the CHG.  Do not shave (including legs and underarms) for at least 48 hours prior to first CHG  shower. It is OK to shave your face.  Please follow these instructions carefully.   1. Shower the NIGHT BEFORE SURGERY and the MORNING OF SURGERY with CHG.   2. If you chose to wash your hair, wash your hair first as usual with your normal shampoo.      After you shampoo,wash your face and private area with the soap you use at home, then rinse your hair and body thoroughly to remove the shampoo and soap.    Use CHG as you would any other liquid soap. You can apply CHG directly to the skin and wash gently with a scrungie or a clean washcloth.   3. Apply the CHG Soap to your body ONLY FROM THE NECK DOWN.  Do not use on open wounds or open sores. Avoid contact with your eyes, ears, mouth and genitals (private parts).  4. Wash thoroughly, paying special attention to the area where your surgery will be performed.  5. Thoroughly rinse your body with warm water from the neck down.  6. DO NOT shower/wash with your normal soap after using and rinsing off the CHG Soap.  7. Pat yourself dry with a CLEAN TOWEL.  8. Wear  CLEAN PAJAMAS to bed the night before surgery, wear comfortable clothes the morning of surgery  9. Place CLEAN SHEETS on your bed the night of your first shower and DO NOT SLEEP WITH PETS.  Day of Surgery: Shower as instructed above. Do not wear lotions, powders, or perfumes, or deodorant. Please wear clean clothes to the hospital/surgery center.   Remember to brush your teeth WITH YOUR REGULAR TOOTHPASTE Do not wear jewelry, make-up or nail polish.  Do not shave 48 hours prior to surgery.    Do not bring valuables to the hospital.  [facility] is not responsible for any belongings or valuables.  Contacts, dentures or bridgework may not be worn into surgery.  Leave your suitcase in the car.  After surgery it may be brought to your room.  For patients admitted to the hospital, discharge time will be determined by your treatment team.  Patients discharged the day of surgery  will not be allowed to drive home.   Please read over the following fact sheets that you were given Pain Booklet, Coughing and Deep Breathing, Surgical Site Infections.

## 2018-10-14 NOTE — Progress Notes (Signed)
PCP: Dr. Helane Rima Cardiologist: Denies  EKG: Today CXR: na ECHO: denies Stress Test: denies Cardiac Cath: denies  Called [facility] with anesthesia.  Pt takes Metformin for PCOS, not a diabetic.  Allison instructed to order labs/EKG based off of pt history interview.  Pt reports that she gets an A1c q33months but has been 18 months since last A1C.  Previous A1C results "Might have showed pre-diabetes" but hard to know since she was told it could be PCOS related.  EKG, BMP, A1C ordered per anesthesia protocol in light of previous elevated A1C.   Patient denies shortness of breath, fever, cough, and chest pain at PAT appointment.  Patient verbalized understanding of instructions provided today at the PAT appointment.  Patient asked to review instructions at home and day of surgery.

## 2018-10-16 ENCOUNTER — Other Ambulatory Visit: Payer: Self-pay

## 2018-10-16 ENCOUNTER — Other Ambulatory Visit (HOSPITAL_COMMUNITY)
Admission: RE | Admit: 2018-10-16 | Discharge: 2018-10-16 | Disposition: A | Discharge status: Home / Self Care | Payer: PRIVATE HEALTH INSURANCE | Source: Ambulatory Visit | Attending: Obstetrics and Gynecology | Admitting: Obstetrics and Gynecology

## 2018-10-16 DIAGNOSIS — Z01818 Encounter for other preprocedural examination: Secondary | ICD-10-CM | POA: Diagnosis not present

## 2018-10-16 MED ORDER — SODIUM CHLORIDE 0.9 % IV SOLN
2.0000 g | INTRAVENOUS | Status: AC
  Administered 2018-10-20: 2 g via INTRAVENOUS
  Filled 2018-10-16: qty 2

## 2018-10-17 LAB — NOVEL CORONAVIRUS, NAA (HOSP ORDER, SEND-OUT TO REF LAB; TAT 18-24 HRS): SARS-CoV-2, NAA: NOT DETECTED

## 2018-10-19 ENCOUNTER — Encounter (HOSPITAL_COMMUNITY): Payer: Self-pay | Admitting: Anesthesiology

## 2018-10-19 NOTE — Anesthesia Preprocedure Evaluation (Addendum)
Anesthesia Evaluation  Patient identified by MRN, date of birth, ID band Patient awake    Reviewed: Allergy & Precautions, NPO status , Patient's Chart, lab work & pertinent test results  History of Anesthesia Complications (+) PONV and history of anesthetic complications  Airway Mallampati: II  TM Distance: >3 FB Neck ROM: Full    Dental no notable dental hx. (+) Teeth Intact   Pulmonary asthma , Patient abstained from smoking.,    Pulmonary exam normal breath sounds clear to auscultation       Cardiovascular negative cardio ROS Normal cardiovascular exam Rhythm:Regular Rate:Normal     Neuro/Psych negative neurological ROS  negative psych ROS   GI/Hepatic GERD  Controlled and Medicated,  Endo/Other  Obesity Hx/o PCOS  Renal/GU negative Renal ROS  negative genitourinary   Musculoskeletal negative musculoskeletal ROS (+)   Abdominal (+) + obese,   Peds  Hematology  (+) anemia ,   Anesthesia Other Findings   Reproductive/Obstetrics negative OB ROS                            Anesthesia Physical Anesthesia Plan  ASA: II  Anesthesia Plan: General   Post-op Pain Management:    Induction: Intravenous  PONV Risk Score and Plan: 4 or greater and Scopolamine patch - Pre-op, Midazolam, Ondansetron, Dexamethasone and Treatment may vary due to age or medical condition  Airway Management Planned: Oral ETT  Additional Equipment:   Intra-op Plan:   Post-operative Plan: Extubation in OR  Informed Consent: I have reviewed the patients History and Physical, chart, labs and discussed the procedure including the risks, benefits and alternatives for the proposed anesthesia with the patient or authorized representative who has indicated his/her understanding and acceptance.     Dental advisory given  Plan Discussed with: CRNA and Surgeon  Anesthesia Plan Comments:        Anesthesia  Quick Evaluation

## 2018-10-20 ENCOUNTER — Inpatient Hospital Stay (HOSPITAL_COMMUNITY): Payer: PRIVATE HEALTH INSURANCE | Admitting: Vascular Surgery

## 2018-10-20 ENCOUNTER — Inpatient Hospital Stay (HOSPITAL_COMMUNITY)
Admission: RE | Admit: 2018-10-20 | Discharge: 2018-10-21 | DRG: 738 | Disposition: A | Discharge status: Home / Self Care | Payer: PRIVATE HEALTH INSURANCE | Source: Ambulatory Visit | Attending: Obstetrics and Gynecology | Admitting: Obstetrics and Gynecology

## 2018-10-20 ENCOUNTER — Inpatient Hospital Stay (HOSPITAL_COMMUNITY): Payer: PRIVATE HEALTH INSURANCE | Admitting: Anesthesiology

## 2018-10-20 ENCOUNTER — Encounter (HOSPITAL_COMMUNITY): Payer: Self-pay | Admitting: Surgery

## 2018-10-20 ENCOUNTER — Encounter (HOSPITAL_COMMUNITY)
Admission: RE | Disposition: A | Discharge status: Home / Self Care | Payer: Self-pay | Source: Ambulatory Visit | Attending: Obstetrics and Gynecology

## 2018-10-20 ENCOUNTER — Other Ambulatory Visit: Payer: Self-pay

## 2018-10-20 DIAGNOSIS — R102 Pelvic and perineal pain unspecified side: Secondary | ICD-10-CM | POA: Diagnosis present

## 2018-10-20 DIAGNOSIS — K219 Gastro-esophageal reflux disease without esophagitis: Secondary | ICD-10-CM | POA: Diagnosis present

## 2018-10-20 DIAGNOSIS — Z7984 Long term (current) use of oral hypoglycemic drugs: Secondary | ICD-10-CM | POA: Diagnosis not present

## 2018-10-20 DIAGNOSIS — Z79899 Other long term (current) drug therapy: Secondary | ICD-10-CM | POA: Diagnosis not present

## 2018-10-20 DIAGNOSIS — N736 Female pelvic peritoneal adhesions (postinfective): Secondary | ICD-10-CM | POA: Diagnosis present

## 2018-10-20 DIAGNOSIS — J302 Other seasonal allergic rhinitis: Secondary | ICD-10-CM | POA: Diagnosis present

## 2018-10-20 DIAGNOSIS — E282 Polycystic ovarian syndrome: Secondary | ICD-10-CM | POA: Diagnosis present

## 2018-10-20 DIAGNOSIS — D3912 Neoplasm of uncertain behavior of left ovary: Secondary | ICD-10-CM | POA: Diagnosis present

## 2018-10-20 DIAGNOSIS — G8929 Other chronic pain: Secondary | ICD-10-CM | POA: Diagnosis present

## 2018-10-20 DIAGNOSIS — N801 Endometriosis of ovary: Secondary | ICD-10-CM | POA: Diagnosis present

## 2018-10-20 DIAGNOSIS — Z23 Encounter for immunization: Secondary | ICD-10-CM | POA: Diagnosis not present

## 2018-10-20 DIAGNOSIS — N80129 Deep endometriosis of ovary, unspecified ovary: Secondary | ICD-10-CM | POA: Diagnosis present

## 2018-10-20 HISTORY — PX: ABDOMINAL HYSTERECTOMY: SHX81

## 2018-10-20 HISTORY — PX: HYSTERECTOMY ABDOMINAL WITH SALPINGO-OOPHORECTOMY: SHX6792

## 2018-10-20 LAB — BASIC METABOLIC PANEL
Anion gap: 12 (ref 5–15)
BUN: 7 mg/dL (ref 6–20)
CO2: 19 mmol/L — ABNORMAL LOW (ref 22–32)
Calcium: 8.6 mg/dL — ABNORMAL LOW (ref 8.9–10.3)
Chloride: 106 mmol/L (ref 98–111)
Creatinine, Ser: 0.88 mg/dL (ref 0.44–1.00)
GFR calc Af Amer: >60 mL/min (ref >60)
GFR calc non Af Amer: >60 mL/min (ref >60)
Glucose, Bld: 199 mg/dL — ABNORMAL HIGH (ref 70–99)
Potassium: 3.9 mmol/L (ref 3.5–5.1)
Sodium: 137 mmol/L (ref 135–145)

## 2018-10-20 LAB — POCT PREGNANCY, URINE: Preg Test, Ur: NEGATIVE

## 2018-10-20 SURGERY — HYSTERECTOMY, ABDOMINAL, WITH SALPINGO-OOPHORECTOMY
Anesthesia: General | Site: Abdomen | Laterality: Bilateral

## 2018-10-20 MED ORDER — HEMOSTATIC AGENTS (NO CHARGE) OPTIME
TOPICAL | Status: DC | PRN
  Administered 2018-10-20: 1 via TOPICAL

## 2018-10-20 MED ORDER — DIPHENHYDRAMINE HCL 12.5 MG/5ML PO ELIX: 12.5000 mg | ORAL_SOLUTION | Freq: Four times a day (QID) | ORAL | Status: DC | PRN

## 2018-10-20 MED ORDER — FENTANYL CITRATE (PF) 250 MCG/5ML IJ SOLN
INTRAMUSCULAR | Status: DC | PRN
  Administered 2018-10-20: 150 ug via INTRAVENOUS
  Administered 2018-10-20 (×2): 50 ug via INTRAVENOUS

## 2018-10-20 MED ORDER — PHENYLEPHRINE 40 MCG/ML (10ML) SYRINGE FOR IV PUSH (FOR BLOOD PRESSURE SUPPORT)
PREFILLED_SYRINGE | INTRAVENOUS | Status: AC
  Filled 2018-10-20: qty 10

## 2018-10-20 MED ORDER — PROPOFOL 10 MG/ML IV BOLUS
INTRAVENOUS | Status: AC
  Filled 2018-10-20: qty 20

## 2018-10-20 MED ORDER — DOCUSATE SODIUM 100 MG PO CAPS
100.0000 mg | ORAL_CAPSULE | Freq: Two times a day (BID) | ORAL | Status: DC
  Administered 2018-10-20 – 2018-10-21 (×2): 100 mg via ORAL
  Filled 2018-10-20 (×2): qty 1

## 2018-10-20 MED ORDER — DEXAMETHASONE SODIUM PHOSPHATE 10 MG/ML IJ SOLN
INTRAMUSCULAR | Status: DC | PRN
  Administered 2018-10-20: 5 mg via INTRAVENOUS

## 2018-10-20 MED ORDER — NALOXONE HCL 0.4 MG/ML IJ SOLN: 0.4000 mg | INTRAMUSCULAR | Status: DC | PRN

## 2018-10-20 MED ORDER — LIDOCAINE 2% (20 MG/ML) 5 ML SYRINGE
INTRAMUSCULAR | Status: DC | PRN
  Administered 2018-10-20: 80 mg via INTRAVENOUS
  Administered 2018-10-20: 20 mg via INTRAVENOUS

## 2018-10-20 MED ORDER — BUPIVACAINE HCL (PF) 0.25 % IJ SOLN
INTRAMUSCULAR | Status: AC
  Filled 2018-10-20: qty 30

## 2018-10-20 MED ORDER — ROCURONIUM BROMIDE 10 MG/ML (PF) SYRINGE
PREFILLED_SYRINGE | INTRAVENOUS | Status: DC | PRN
  Administered 2018-10-20: 10 mg via INTRAVENOUS
  Administered 2018-10-20: 20 mg via INTRAVENOUS
  Administered 2018-10-20: 50 mg via INTRAVENOUS

## 2018-10-20 MED ORDER — DIPHENHYDRAMINE HCL 50 MG/ML IJ SOLN: 12.5000 mg | Freq: Four times a day (QID) | INTRAMUSCULAR | Status: DC | PRN

## 2018-10-20 MED ORDER — SUGAMMADEX SODIUM 200 MG/2ML IV SOLN
INTRAVENOUS | Status: DC | PRN
  Administered 2018-10-20: 200 mg via INTRAVENOUS

## 2018-10-20 MED ORDER — ONDANSETRON HCL 4 MG/2ML IJ SOLN: 4.0000 mg | Freq: Four times a day (QID) | INTRAMUSCULAR | Status: DC | PRN

## 2018-10-20 MED ORDER — INFLUENZA VAC SPLIT QUAD 0.5 ML IM SUSY
0.5000 mL | PREFILLED_SYRINGE | INTRAMUSCULAR | Status: AC
  Administered 2018-10-21: 13:00:00 0.5 mL via INTRAMUSCULAR
  Filled 2018-10-20: qty 0.5

## 2018-10-20 MED ORDER — FENTANYL CITRATE (PF) 250 MCG/5ML IJ SOLN
INTRAMUSCULAR | Status: AC
  Filled 2018-10-20: qty 5

## 2018-10-20 MED ORDER — TRAMADOL HCL 50 MG PO TABS
50.0000 mg | ORAL_TABLET | Freq: Four times a day (QID) | ORAL | Status: DC | PRN
  Administered 2018-10-21: 50 mg via ORAL
  Filled 2018-10-20: qty 1

## 2018-10-20 MED ORDER — HYDROMORPHONE 1 MG/ML IV SOLN
INTRAVENOUS | Status: AC
  Filled 2018-10-20: qty 30

## 2018-10-20 MED ORDER — ONDANSETRON HCL 4 MG/2ML IJ SOLN: 4.0000 mg | Freq: Once | INTRAMUSCULAR | Status: DC | PRN

## 2018-10-20 MED ORDER — BUPIVACAINE HCL (PF) 0.25 % IJ SOLN
INTRAMUSCULAR | Status: DC | PRN
  Administered 2018-10-20: 10 mL

## 2018-10-20 MED ORDER — SODIUM CHLORIDE 0.9% FLUSH: 9.0000 mL | INTRAVENOUS | Status: DC | PRN

## 2018-10-20 MED ORDER — SCOPOLAMINE 1 MG/3DAYS TD PT72
MEDICATED_PATCH | TRANSDERMAL | Status: AC
  Filled 2018-10-20: qty 1

## 2018-10-20 MED ORDER — MIDAZOLAM HCL 5 MG/5ML IJ SOLN
INTRAMUSCULAR | Status: DC | PRN
  Administered 2018-10-20: 2 mg via INTRAVENOUS

## 2018-10-20 MED ORDER — LACTATED RINGERS IV SOLN
INTRAVENOUS | Status: DC
  Administered 2018-10-20 (×2): via INTRAVENOUS

## 2018-10-20 MED ORDER — ROCURONIUM BROMIDE 10 MG/ML (PF) SYRINGE
PREFILLED_SYRINGE | INTRAVENOUS | Status: AC
  Filled 2018-10-20: qty 10

## 2018-10-20 MED ORDER — PROMETHAZINE HCL 25 MG/ML IJ SOLN
6.2500 mg | Freq: Once | INTRAMUSCULAR | Status: AC
  Administered 2018-10-20: 10:00:00 6.25 mg via INTRAVENOUS

## 2018-10-20 MED ORDER — PROPOFOL 10 MG/ML IV BOLUS
INTRAVENOUS | Status: DC | PRN
  Administered 2018-10-20: 180 mg via INTRAVENOUS

## 2018-10-20 MED ORDER — KETOROLAC TROMETHAMINE 30 MG/ML IJ SOLN
INTRAMUSCULAR | Status: AC
  Filled 2018-10-20: qty 1

## 2018-10-20 MED ORDER — MENTHOL 3 MG MT LOZG: 1.0000 | LOZENGE | OROMUCOSAL | Status: DC | PRN

## 2018-10-20 MED ORDER — KETOROLAC TROMETHAMINE 30 MG/ML IJ SOLN
30.0000 mg | Freq: Once | INTRAMUSCULAR | Status: AC
  Administered 2018-10-20: 30 mg via INTRAVENOUS

## 2018-10-20 MED ORDER — ONDANSETRON HCL 4 MG/2ML IJ SOLN
INTRAMUSCULAR | Status: AC
  Filled 2018-10-20: qty 4

## 2018-10-20 MED ORDER — PROMETHAZINE HCL 25 MG/ML IJ SOLN
INTRAMUSCULAR | Status: AC
  Filled 2018-10-20: qty 1

## 2018-10-20 MED ORDER — ONDANSETRON HCL 4 MG PO TABS: 4.0000 mg | ORAL_TABLET | Freq: Four times a day (QID) | ORAL | Status: DC | PRN

## 2018-10-20 MED ORDER — ONDANSETRON HCL 4 MG/2ML IJ SOLN
INTRAMUSCULAR | Status: DC | PRN
  Administered 2018-10-20 (×2): 4 mg via INTRAVENOUS

## 2018-10-20 MED ORDER — LIDOCAINE 2% (20 MG/ML) 5 ML SYRINGE
INTRAMUSCULAR | Status: AC
  Filled 2018-10-20: qty 5

## 2018-10-20 MED ORDER — SIMETHICONE 80 MG PO CHEW: 80.0000 mg | CHEWABLE_TABLET | Freq: Four times a day (QID) | ORAL | Status: DC | PRN

## 2018-10-20 MED ORDER — HYDROMORPHONE 1 MG/ML IV SOLN
INTRAVENOUS | Status: DC
  Administered 2018-10-20: 1.5 mg via INTRAVENOUS
  Administered 2018-10-20: 1 mg via INTRAVENOUS
  Administered 2018-10-20: 0.6 mg via INTRAVENOUS
  Administered 2018-10-20: 30 mg via INTRAVENOUS
  Administered 2018-10-21: 0.2 mg via INTRAVENOUS
  Administered 2018-10-21: 0.4 mg via INTRAVENOUS

## 2018-10-20 MED ORDER — MIDAZOLAM HCL 2 MG/2ML IJ SOLN
INTRAMUSCULAR | Status: AC
  Filled 2018-10-20: qty 2

## 2018-10-20 MED ORDER — EPHEDRINE 5 MG/ML INJ
INTRAVENOUS | Status: AC
  Filled 2018-10-20: qty 10

## 2018-10-20 MED ORDER — SUCCINYLCHOLINE CHLORIDE 200 MG/10ML IV SOSY
PREFILLED_SYRINGE | INTRAVENOUS | Status: AC
  Filled 2018-10-20: qty 10

## 2018-10-20 MED ORDER — DEXAMETHASONE SODIUM PHOSPHATE 10 MG/ML IJ SOLN
INTRAMUSCULAR | Status: AC
  Filled 2018-10-20: qty 1

## 2018-10-20 MED ORDER — LACTATED RINGERS IV SOLN
INTRAVENOUS | Status: DC
  Administered 2018-10-20: 10:00:00 via INTRAVENOUS

## 2018-10-20 MED ORDER — SCOPOLAMINE 1 MG/3DAYS TD PT72
MEDICATED_PATCH | TRANSDERMAL | Status: DC | PRN
  Administered 2018-10-20: 1 via TRANSDERMAL

## 2018-10-20 MED ORDER — OXYCODONE HCL 5 MG PO TABS
5.0000 mg | ORAL_TABLET | ORAL | Status: DC | PRN
  Administered 2018-10-21: 5 mg via ORAL
  Filled 2018-10-20: qty 1

## 2018-10-20 SURGICAL SUPPLY — 48 items
ADH SKN CLS APL DERMABOND .7 (GAUZE/BANDAGES/DRESSINGS) ×1
APL SKNCLS STERI-STRIP NONHPOA (GAUZE/BANDAGES/DRESSINGS) ×1
BENZOIN TINCTURE PRP APPL 2/3 (GAUZE/BANDAGES/DRESSINGS) ×2 IMPLANT
BRR ADH 6X5 SEPRAFILM 1 SHT (MISCELLANEOUS)
CANISTER SUCT 3000ML PPV (MISCELLANEOUS) ×3 IMPLANT
CLOSURE WOUND 1/2 X4 (GAUZE/BANDAGES/DRESSINGS) ×1
COVER WAND RF STERILE (DRAPES) ×3 IMPLANT
DECANTER SPIKE VIAL GLASS SM (MISCELLANEOUS) ×2 IMPLANT
DERMABOND ADVANCED (GAUZE/BANDAGES/DRESSINGS) ×2
DERMABOND ADVANCED .7 DNX12 (GAUZE/BANDAGES/DRESSINGS) ×1 IMPLANT
DRAPE CESAREAN BIRTH W POUCH (DRAPES) ×3 IMPLANT
DRAPE WARM FLUID 44X44 (DRAPES) IMPLANT
DRSG OPSITE POSTOP 4X10 (GAUZE/BANDAGES/DRESSINGS) ×3 IMPLANT
DURAPREP 26ML APPLICATOR (WOUND CARE) ×3 IMPLANT
ELECT BLADE 4.0 EZ CLEAN MEGAD (MISCELLANEOUS) ×3
ELECTRODE BLDE 4.0 EZ CLN MEGD (MISCELLANEOUS) IMPLANT
GAUZE 4X4 16PLY RFD (DISPOSABLE) ×2 IMPLANT
GLOVE BIO SURGEON STRL SZ 6.5 (GLOVE) ×4 IMPLANT
GLOVE BIO SURGEONS STRL SZ 6.5 (GLOVE) ×3
GLOVE BIOGEL PI IND STRL 7.0 (GLOVE) ×2 IMPLANT
GLOVE BIOGEL PI INDICATOR 7.0 (GLOVE) ×4
GLOVE NEODERM STER SZ 7 (GLOVE) ×4 IMPLANT
GOWN STRL REUS W/ TWL LRG LVL3 (GOWN DISPOSABLE) ×3 IMPLANT
GOWN STRL REUS W/TWL LRG LVL3 (GOWN DISPOSABLE) ×9
HEMOSTAT ARISTA ABSORB 3G PWDR (HEMOSTASIS) ×2 IMPLANT
HIBICLENS CHG 4% 4OZ BTL (MISCELLANEOUS) ×3 IMPLANT
KIT TURNOVER KIT B (KITS) ×3 IMPLANT
NEEDLE HYPO 22GX1.5 SAFETY (NEEDLE) ×3 IMPLANT
NS IRRIG 1000ML POUR BTL (IV SOLUTION) ×3 IMPLANT
PACK ABDOMINAL GYN (CUSTOM PROCEDURE TRAY) ×3 IMPLANT
PAD ARMBOARD 7.5X6 YLW CONV (MISCELLANEOUS) ×3 IMPLANT
PAD OB MATERNITY 4.3X12.25 (PERSONAL CARE ITEMS) ×3 IMPLANT
SEPRAFILM MEMBRANE 5X6 (MISCELLANEOUS) IMPLANT
SPONGE LAP 18X18 RF (DISPOSABLE) IMPLANT
STAPLER VISISTAT 35W (STAPLE) IMPLANT
STRIP CLOSURE SKIN 1/2X4 (GAUZE/BANDAGES/DRESSINGS) ×1 IMPLANT
SUT PDS AB 0 CT 36 (SUTURE) IMPLANT
SUT PDS AB 0 CTX 60 (SUTURE) IMPLANT
SUT PLAIN 2 0 XLH (SUTURE) ×2 IMPLANT
SUT VIC AB 0 CT1 18XCR BRD8 (SUTURE) ×2 IMPLANT
SUT VIC AB 0 CT1 27 (SUTURE) ×12
SUT VIC AB 0 CT1 27XBRD ANBCTR (SUTURE) ×4 IMPLANT
SUT VIC AB 0 CT1 8-18 (SUTURE) ×9
SUT VIC AB 4-0 KS 27 (SUTURE) ×3 IMPLANT
SUT VICRYL 0 TIES 12 18 (SUTURE) ×3 IMPLANT
SYR CONTROL 10ML LL (SYRINGE) ×3 IMPLANT
TOWEL GREEN STERILE FF (TOWEL DISPOSABLE) ×6 IMPLANT
TRAY FOLEY W/BAG SLVR 14FR (SET/KITS/TRAYS/PACK) ×3 IMPLANT

## 2018-10-20 NOTE — Progress Notes (Signed)
Patient doing well. Some pain near incision BP 114/86 (BP Location: Right Arm)   Pulse 65   Temp 97.6 F (36.4 C) (Oral)   Resp 18   Ht 5\' 5"  (1.651 m)   Wt 101.1 kg   SpO2 97%   BMI 37.09 kg/m  Urine output very good and clear Abdomen is soft and bandage clean and dry and intact No vaginal drainage  POD # 0 Doing well Advance diet as tolerated Surgery discussed with patient

## 2018-10-20 NOTE — Progress Notes (Signed)
No changes on H and P  BP 126/74   Pulse 80   Temp 98.8 F (37.1 C)   Resp 17   SpO2 98%  Results for orders placed or performed during the hospital encounter of 10/20/18 (from the past 24 hour(s))  Pregnancy, urine POC     Status: None   Collection Time: 10/20/18  6:27 AM  Result Value Ref Range   Preg Test, Ur NEGATIVE NEGATIVE   Will proceed with TAH and BSO Consent signed

## 2018-10-20 NOTE — Anesthesia Procedure Notes (Signed)
Procedure Name: Intubation Date/Time: 10/20/2018 7:19 AM Performed by: Marsa Aris, CRNA Pre-anesthesia Checklist: Patient identified, Emergency Drugs available, Suction available and Patient being monitored Patient Re-evaluated:Patient Re-evaluated prior to induction Oxygen Delivery Method: Circle System Utilized Preoxygenation: Pre-oxygenation with 100% oxygen Induction Type: IV induction Ventilation: Mask ventilation without difficulty Laryngoscope Size: Miller and 2 Grade View: Grade I Tube type: Oral Tube size: 7.0 mm Number of attempts: 1 Airway Equipment and Method: Stylet and Bite block Placement Confirmation: ETT inserted through vocal cords under direct vision,  positive ETCO2 and breath sounds checked- equal and bilateral Secured at: 21 cm Tube secured with: Tape Dental Injury: Teeth and Oropharynx as per pre-operative assessment  Comments: No change in dentition from pre-procedure

## 2018-10-20 NOTE — Op Note (Signed)
[name], [name] MEDICAL RECORD [id number] ACCOUNT [id number] DATE OF BIRTH:1980-05-19 FACILITY: [facility] LOCATION: [facility] PHYSICIAN:Fatimah Sundquist Lynett Fish, MD  OPERATIVE REPORT  DATE OF PROCEDURE:  10/20/2018  PREOPERATIVE DIAGNOSIS:  Pelvic pain and possible left ovarian endometrioma.   POSTOPERATIVE DIAGNOSIS:  Pelvic pain, pelvic adhesions, left ovarian endometrioma.    PROCEDURE:  Laparotomy, total abdominal hysterectomy, bilateral salpingo-oophorectomy and lysis of adhesions.  SURGEON:  Dian Queen, MD  ASSISTANT:  Lucillie Garfinkel, MD  ANESTHESIA:  General.  ESTIMATED BLOOD LOSS:  250 mL.  COMPLICATIONS:  None.  DRAINS:  Foley catheter.  PATHOLOGY:  Cervix, uterus, tubes, and ovaries sent to pathology.  DESCRIPTION OF PROCEDURE:  The patient was taken to the operating room.  Her anesthesia was administered in the standard fashion.  She was prepped and draped and a Foley catheter was inserted and draining clear urine.  After a sterile drape was applied  and time-out was performed in standard fashion, I then made a small low transverse incision in the skin and it was carried down to the fascia.  The fascia was scored in the midline and extended laterally.  There was some scar tissue in the subcutaneous  most likely due to her cesarean section in the past.  After we entered the peritoneum, the peritoneal incision was then stretched.  I could see that she had filmy adhesions involving the omentum and the bowel to the pelvis.  We were able to free those  with sharp and blunt dissection with careful attention to avoid any injury to any surrounding organs.  We then placed a self-retaining retractor in the abdominal cavity and with excellent visualization, I could see the uterus appeared to be unremarkable.   The left ovary was enlarged, consistent with an endometrioma.  Right ovary was unremarkable.  After we placed the large and small bowel in the upper abdomen in a standard  fashion, we proceeded with a hysterectomy.  By grasping the triple pedicle  initially on the right side with a curved Heaney clamp, elevating that suture ligating the broad ligament on the right side.  This was done with good hemostasis.  This was performed with 0 Vicryl suture.  We then cauterized the broad ligament and then  carried that down and incised anterior and posterior leaves of the broad ligament in standard fashion.  We then developed an avascular window beneath the right IP and clamped that with a curved Heaney clamp with careful attention to avoid the ureter,  which was well below our clamps.  We then suture ligated that with 0 Vicryl suture and further secured that with a free tie of 0 Vicryl suture.  We then developed the bladder flap with both sharp and blunt dissection.  This was performed on the left side  in similar fashion.  We then placed curved Heaney clamps across the uterine artery at the level of the internal os and the pedicle was suture ligated using 0 Vicryl suture.  We then amputated the top of the uterus so we could see the cervix more easily  because she did have a deep pelvis and then was able to place straight Heaney clamps just across the uterosacral cardinal ligament, staying snug beside the cervix.  Each pedicle was clamped, cut and suture ligated using 0 Vicryl suture.  I then placed  curved Heaney clamps across beneath the external os and removed the cervix.  We then closed the vaginal cuff with a running stitch using 0 Vicryl suture.  Irrigation was  performed.  Hemostasis was very good.  I did place Arista across the pelvis and  along all pedicles and sidewalls just for further hemostasis.  All sponges and laps and instruments were removed from the abdominal cavity.  The peritoneum was closed using 0 Vicryl.  The fascia was closed using 0 Vicryl in a running stitch x2.  The  subcutaneous was closed with plain gut interrupteds and the skin was closed with 3-0 Vicryl in  a subcuticular fashion.  Benzoin, Steri-Strips and a dressing were applied.  Local was infiltrated.  The patient tolerated the procedure well.  All sponge, lap  and instrument counts were correct x2.  The patient went to recovery room in stable condition.  TN/NUANCE  D:10/20/2018 T:10/20/2018 JOB:[id number]

## 2018-10-20 NOTE — Transfer of Care (Signed)
Immediate Anesthesia Transfer of Care Note  Patient: [name]  Procedure(s) Performed: HYSTERECTOMY ABDOMINAL WITH SALPINGO-OOPHORECTOMY, Lysis of Adhesions (Bilateral Abdomen)  Patient Location: PACU  Anesthesia Type:General  Level of Consciousness: awake, alert  and oriented  Airway & Oxygen Therapy: Patient Spontanous Breathing  Post-op Assessment: Report given to RN and Post -op Vital signs reviewed and stable  Post vital signs: Reviewed and stable BP 130/85, SpO2 96%  Last Vitals:  Vitals Value Taken Time  BP    Temp    Pulse    Resp    SpO2      Last Pain:  Vitals:   10/20/18 0638  PainSc: 1       Patients Stated Pain Goal: 3 ([id number])  Complications: No apparent anesthesia complications

## 2018-10-20 NOTE — Anesthesia Postprocedure Evaluation (Signed)
Anesthesia Post Note  Patient: [name]  Procedure(s) Performed: HYSTERECTOMY ABDOMINAL WITH SALPINGO-OOPHORECTOMY, Lysis of Adhesions (Bilateral Abdomen)     Patient location during evaluation: PACU Anesthesia Type: General Level of consciousness: awake and alert and oriented Pain management: pain level controlled Vital Signs Assessment: post-procedure vital signs reviewed and stable Respiratory status: spontaneous breathing, nonlabored ventilation and respiratory function stable Cardiovascular status: blood pressure returned to baseline and stable Anesthetic complications: no    Last Vitals:  Vitals:   10/20/18 0931 10/20/18 0959  BP: 119/80 114/86  Pulse: 75 65  Resp: 19 18  Temp: 36.4 C 36.4 C  SpO2: 95% 95%    Last Pain:  Vitals:   10/20/18 0959  TempSrc: Oral  PainSc:                  Carlean Crowl A.

## 2018-10-20 NOTE — Brief Op Note (Signed)
10/20/2018  8:42 AM  PATIENT:  [name]  38 y.o. female  PRE-OPERATIVE DIAGNOSIS: Pelvic pain Left ovarian endometrioma  POST-OPERATIVE DIAGNOSIS: Left ovarian endometrioma Pelvic adhesions  PROCEDURE:  Procedure(s): HYSTERECTOMY ABDOMINAL WITH SALPINGO-OOPHORECTOMY, Lysis of Adhesions (Bilateral)  SURGEON:  Surgeon(s) and Role:    * Dian Queen, MD - Primary    * Royston Sinner, Colin Benton, MD - Assisting  PHYSICIAN ASSISTANT:   ASSISTANTS: none   ANESTHESIA:   general  EBL:  250 mL   BLOOD ADMINISTERED:none  DRAINS: Urinary Catheter (Foley)   LOCAL MEDICATIONS USED:  LIDOCAINE   SPECIMEN:  Source of Specimen:  uterus, tubes and ovaries and cervix  DISPOSITION OF SPECIMEN:  PATHOLOGY  COUNTS:  YES  TOURNIQUET:  * No tourniquets in log *  DICTATION: .Other Dictation: Dictation Number dictated  PLAN OF CARE: Admit to inpatient   PATIENT DISPOSITION:  PACU - hemodynamically stable.   Delay start of Pharmacological VTE agent (>24hrs) due to surgical blood loss or risk of bleeding: not applicable

## 2018-10-21 ENCOUNTER — Encounter (HOSPITAL_COMMUNITY): Payer: Self-pay | Admitting: Obstetrics and Gynecology

## 2018-10-21 MED ORDER — IBUPROFEN 600 MG PO TABS: 600.0000 mg | ORAL_TABLET | Freq: Four times a day (QID) | ORAL | Status: DC | PRN

## 2018-10-21 MED ORDER — OXYCODONE HCL 5 MG PO TABS: 5.0000 mg | ORAL_TABLET | ORAL | 0 refills | Status: DC | PRN

## 2018-10-21 MED ORDER — IBUPROFEN 600 MG PO TABS: 600.0000 mg | ORAL_TABLET | Freq: Four times a day (QID) | ORAL | 0 refills | Status: AC | PRN

## 2018-10-21 NOTE — Discharge Summary (Signed)
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  On the day of surgery, surgery was uncomplicated.  Foley catheter removed on POD # 1  Diet advanced to regular without any problem Pain control good on po Ibuprofen and Percocet  Pathology - granulosa cell tumor of left ovary  Recommend inhibin level which was drawn and gyn oncology referral  BP 102/66 (BP Location: Right Arm)   Pulse 84   Temp 98.1 F (36.7 C) (Oral)   Resp 16   Ht 5\' 5"  (1.651 m)   Wt 101.1 kg   SpO2 100%   BMI 37.09 kg/m  No results found for this or any previous visit (from the past 24 hour(s)).  Abdomen soft and non tender Discharged home in good condition Rx Ibuprofen and percocet Follow up in 1 week I will arrange gyn oncology appointment

## 2018-10-21 NOTE — Progress Notes (Signed)
Patient discharged to home with instructions. 

## 2018-10-21 NOTE — Progress Notes (Signed)
25 mls of Dilaudid soln wasted to stericycle with Nurse Ludwig Clarks.

## 2018-10-21 NOTE — Progress Notes (Signed)
Pathology - Dr. Gari Crown  Telephone report  Left ovary - granulosa cell tumor Appears to be Stage 1  Discussed with patient and her spouse Recommend  Inhibin level Refer to gyn onc for pathology review Most likely no need for further therapy

## 2018-10-21 NOTE — Plan of Care (Signed)
  Problem: Education: Goal: Knowledge of General Education information will improve Description: Including pain rating scale, medication(s)/side effects and non-pharmacologic comfort measures Outcome: Progressing   Problem: Clinical Measurements: Goal: Will remain free from infection Outcome: Progressing   Problem: Clinical Measurements: Goal: Diagnostic test results will improve Outcome: Progressing   Problem: Activity: Goal: Risk for activity intolerance will decrease Outcome: Progressing   Problem: Clinical Measurements: Goal: Respiratory complications will improve Outcome: Progressing   Problem: Nutrition: Goal: Adequate nutrition will be maintained Outcome: Progressing   Problem: Coping: Goal: Level of anxiety will decrease Outcome: Progressing

## 2018-10-21 NOTE — Progress Notes (Signed)
Patient doing well Pain is manageable Voided x 2 BP 102/66 (BP Location: Right Arm)   Pulse 84   Temp 98.1 F (36.7 C) (Oral)   Resp 16   Ht 5\' 5"  (1.651 m)   Wt 101.1 kg   SpO2 100%   BMI 37.09 kg/m  Results for orders placed or performed during the hospital encounter of 10/20/18 (from the past 24 hour(s))  Basic metabolic panel     Status: Abnormal   Collection Time: 10/20/18 11:31 AM  Result Value Ref Range   Sodium 137 135 - 145 mmol/L   Potassium 3.9 3.5 - 5.1 mmol/L   Chloride 106 98 - 111 mmol/L   CO2 19 (L) 22 - 32 mmol/L   Glucose, Bld 199 (H) 70 - 99 mg/dL   BUN 7 6 - 20 mg/dL   Creatinine, Ser 0.88 0.44 - 1.00 mg/dL   Calcium 8.6 (L) 8.9 - 10.3 mg/dL   GFR calc non Af Amer >60 >60 mL/min   GFR calc Af Amer >60 >60 mL/min   Anion gap 12 5 - 15   Abdomen is soft and flat Slightly tender Honeycomb removed and incision is dry  Dressing to be replaced  POD # 1  Doing well Routine care Change to po pain meds Ambulate Advance diet Reevaluate at lunchtime about discharge today or tomorrow

## 2018-10-22 LAB — INHIBIN B: Inhibin B: 10.5 pg/mL

## 2018-10-23 ENCOUNTER — Telehealth: Payer: Self-pay | Admitting: *Deleted

## 2018-10-23 NOTE — Telephone Encounter (Signed)
Called and spoke with the patient, gave the appt for 9/25 along with directions

## 2018-11-06 ENCOUNTER — Other Ambulatory Visit: Payer: Self-pay

## 2018-11-06 ENCOUNTER — Encounter: Payer: Self-pay | Admitting: Gynecologic Oncology

## 2018-11-06 ENCOUNTER — Inpatient Hospital Stay: Payer: PRIVATE HEALTH INSURANCE | Attending: Gynecologic Oncology | Admitting: Gynecologic Oncology

## 2018-11-06 VITALS — BP 102/74 | HR 80 | Temp 97.8°F | Resp 18 | Ht 65.5 in | Wt 222.0 lb

## 2018-11-06 DIAGNOSIS — Z803 Family history of malignant neoplasm of breast: Secondary | ICD-10-CM | POA: Insufficient documentation

## 2018-11-06 DIAGNOSIS — K219 Gastro-esophageal reflux disease without esophagitis: Secondary | ICD-10-CM | POA: Diagnosis not present

## 2018-11-06 DIAGNOSIS — Z90710 Acquired absence of both cervix and uterus: Secondary | ICD-10-CM | POA: Diagnosis not present

## 2018-11-06 DIAGNOSIS — D3912 Neoplasm of uncertain behavior of left ovary: Secondary | ICD-10-CM | POA: Insufficient documentation

## 2018-11-06 DIAGNOSIS — Z7984 Long term (current) use of oral hypoglycemic drugs: Secondary | ICD-10-CM | POA: Insufficient documentation

## 2018-11-06 DIAGNOSIS — F17210 Nicotine dependence, cigarettes, uncomplicated: Secondary | ICD-10-CM | POA: Insufficient documentation

## 2018-11-06 DIAGNOSIS — E119 Type 2 diabetes mellitus without complications: Secondary | ICD-10-CM | POA: Insufficient documentation

## 2018-11-06 DIAGNOSIS — Z90722 Acquired absence of ovaries, bilateral: Secondary | ICD-10-CM | POA: Insufficient documentation

## 2018-11-06 NOTE — Progress Notes (Signed)
Consult Note: Gyn-Onc  Consult was requested by Dr. Helane Rima for the evaluation of [name] 38 y.o. female  CC:  Chief Complaint  Patient presents with  . granulosa cell tumor of Left Ovary    Assessment/Plan:  [name]  is a 38 y.o.  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   We will obtain CT abdomen and pelvis to evaluate for gross extraovarian disease.  I explained that this is rare and granulosa cell tumors, however would change recommendations and we would recommend complete surgical debulking followed by adjuvant chemotherapy if we identify this.  If the CT scan is normal we can resume surveillance visits.  I explained that the risk of recurrence for stage I granulosa cell tumor is low with the risk of recurrence somewhere between 5 and 10%.  I am recommending 10 years of surveillance with 6 monthly evaluations including pelvic examinations and tumor marker assessment with inhibin B and anti-mullarian hormone testing.  We discussed that estrogen replacement therapy until age of natural menopause is not unreasonable for patients with stage Ia disease including of these hormonally sensitive tumors.  HPI: [name] is a 38 year old woman who is seen in consultation at the request of Dr Helane Rima for evaluation of granulosa cell tumor of the ovary.   The patient has a longstanding history of ovarian cysts.  She had a cyst that had been observed since May 2019 although it had potentially gone away in the summer 2019 but then a new cyst that developed in March 2020.  When the cyst developed more complex findings on an ultrasound scan on July 30, 2018 in which it measured 4.2 x 3.1 cm and 3.5 x 2 cm with solid areas and complexity.  A decision was made to proceed with surgical excision.  As she had had a complex gynecologic history and infertility, with PCOS, she made a decision for a total abdominal hysterectomy  and BSO.  Dr. Helane Rima performed a total abdominal hysterectomy and BSO at [facility] on October 20, 2018.  Review of the operative note confirms that there was no gross gross extraovarian disease identified.  There was a left adnexal mass that was visualized.  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  It was an adult type tumor.  Washings had not been obtained.  The right ovary was benign.  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  Inhibin B was drawn immediately postoperatively on October 21, 2018 and was normal at 10.5.  A Ca1 25 had been drawn on Jul 02, 2018 and was normal at 18.7.  Postoperatively she did well with no significant complications.  This patient's gynecologic history is complex for infertility and polycystic ovarian disease, and a long history of assisted reproductive technology including extended Clomid use.  She had had a twin pregnancy in 2008 which ended with a premature cesarean section and ultimate demise of the twins.  She also had a midtrimester D and E for loss of a desired pregnancy.  Unfortunately she never was able to carry a pregnancy to viability.  Her past surgical history is remarkable for a cesarean section, cervical cerclage, cholecystectomy, D&C in 2018, and a total abdominal hysterectomy with BSO.  Family history significant for maternal uncle  with history of a tumor in his chest possible Hodgkin's lymphoma.  She is a great aunt who had breast cancer at age 46, and a great grandmother who had a cancer at age 5.  Current Meds:  Outpatient Encounter Medications as of 11/06/2018  Medication Sig  . acetaminophen (TYLENOL) 500 MG tablet Take 500-1,000 mg by mouth every 6 (six) hours as needed (for pain.).  Marland Kitchen calcium carbonate (TUMS - DOSED IN MG ELEMENTAL CALCIUM) 500 MG chewable tablet Chew 2  tablets by mouth 2 (two) times daily as needed for indigestion or heartburn.  Marland Kitchen ibuprofen (ADVIL) 600 MG tablet Take 1 tablet (600 mg total) by mouth every 6 (six) hours as needed for mild pain or moderate pain.  . metFORMIN (GLUCOPHAGE) 500 MG tablet Take 500-1,000 mg by mouth See admin instructions. Take 1 tablets (1000 mg) by mouth at breakfast 1 tablet (500 mg) by mouth at supper.  . Multiple Vitamin (MULTIVITAMIN WITH MINERALS) TABS tablet Take 1 tablet by mouth at bedtime.  . [DISCONTINUED] oxyCODONE (OXY IR/ROXICODONE) 5 MG immediate release tablet Take 1-2 tablets (5-10 mg total) by mouth every 4 (four) hours as needed for moderate pain. (Patient not taking: Reported on 11/06/2018)   No facility-administered encounter medications on file as of 11/06/2018.     Allergy:  Allergies  Allergen Reactions  . Sulfa Antibiotics Hives and Rash  . Oxycodone Hcl Nausea Only    Social Hx:   Social History   Socioeconomic History  . Marital status: Married    Spouse name: Not on file  . Number of children: Not on file  . Years of education: Not on file  . Highest education level: Not on file  Occupational History  . Not on file  Social Needs  . Financial resource strain: Not on file  . Food insecurity    Worry: Not on file    Inability: Not on file  . Transportation needs    Medical: Not on file    Non-medical: Not on file  Tobacco Use  . Smoking status: Current Every Day Smoker    Packs/day: 0.25    Types: Cigarettes  . Smokeless tobacco: Never Used  Substance and Sexual Activity  . Alcohol use: Yes    Comment: occasionally  . Drug use: No  . Sexual activity: Not Currently    Birth control/protection: None  Lifestyle  . Physical activity    Days per week: Not on file    Minutes per session: Not on file  . Stress: Not on file  Relationships  . Social Herbalist on phone: Not on file    Gets together: Not on file    Attends religious service: Not on file     Active member of club or organization: Not on file    Attends meetings of clubs or organizations: Not on file    Relationship status: Not on file  . Intimate partner violence    Fear of current or ex partner: Not on file    Emotionally abused: Not on file    Physically abused: Not on file    Forced sexual activity: Not on file  Other Topics Concern  . Not on file  Social History Narrative  . Not on file    Past Surgical Hx:  Past Surgical History:  Procedure Laterality Date  . ABDOMINAL HYSTERECTOMY  10/20/2018  . CERVICAL CERCLAGE    . CESAREAN SECTION    . CHOLECYSTECTOMY    .  DILATION AND EVACUATION N/A 04/30/2016   Procedure: DILATATION AND EVACUATION;  Surgeon: Dian Queen, MD;  Location: [place];  Service: Gynecology;  Laterality: N/A;  . HYSTERECTOMY ABDOMINAL WITH SALPINGO-OOPHORECTOMY Bilateral 10/20/2018   Procedure: HYSTERECTOMY ABDOMINAL WITH SALPINGO-OOPHORECTOMY, Lysis of Adhesions;  Surgeon: Dian Queen, MD;  Location: [place];  Service: Gynecology;  Laterality: Bilateral;    Past Medical Hx:  Past Medical History:  Diagnosis Date  . Anemia    history with pregnancy  . Complication of anesthesia    difficult time waking up after gallbladder surgery, blood pressure drop with C section after babies delivered  . GERD (gastroesophageal reflux disease)   . PCOS (polycystic ovarian syndrome)   . PONV (postoperative nausea and vomiting)   . Pre-diabetes    Unsure, A1C checked since on Metformin for PCOS--metformin has been increased, no official diagnosis of pre-DM  . Seasonal allergies     Past Gynecological History:  See HPi No LMP recorded. (Menstrual status: Irregular Periods).  Family Hx: History reviewed. No pertinent family history.  Review of Systems:  Constitutional  Feels well,    ENT Normal appearing ears and nares bilaterally Skin/Breast  No rash, sores, jaundice, itching, dryness Cardiovascular  No chest pain, shortness of  breath, or edema  Pulmonary  No cough or wheeze.  Gastro Intestinal  No nausea, vomitting, or diarrhoea. No bright red blood per rectum, no abdominal pain, change in bowel movement, or constipation.  Genito Urinary  No frequency, urgency, dysuria, no bleeding Musculo Skeletal  No myalgia, arthralgia, joint swelling or pain  Neurologic  No weakness, numbness, change in gait,  Psychology  No depression, anxiety, insomnia.   Vitals:  Blood pressure 102/74, pulse 80, temperature 97.8 F (36.6 C), temperature source Temporal, resp. rate 18, height 5' 5.5" (1.664 m), weight 222 lb (100.7 kg), SpO2 100 %.  Physical Exam: WD in NAD Neck  Supple NROM, without any enlargements.  Lymph Node Survey No cervical supraclavicular or inguinal adenopathy Cardiovascular  Pulse normal rate, regularity and rhythm. S1 and S2 normal.  Lungs  Clear to auscultation bilateraly, without wheezes/crackles/rhonchi. Good air movement.  Skin  No rash/lesions/breakdown  Psychiatry  Alert and oriented to person, place, and time  Abdomen  Normoactive bowel sounds, abdomen soft, non-tender and obese without evidence of hernia. Well healed low transverse incision. Back No CVA tenderness Genito Urinary  deferred Rectal  deferred Extremities  No bilateral cyanosis, clubbing or edema.   Thereasa Solo, MD  11/06/2018, 6:12 PM

## 2018-11-06 NOTE — Patient Instructions (Signed)
Dr Denman George is recommending 6 monthly exams with pelvic exam (pap not needed), and tumor markers (inhibin B and Mullerian Inhibiting Substance).  Please contact Dr Serita Grit office (at [phone]) in January, 2021 to request an appointment with her for March, 2021.  Please return to see Dr Helane Rima for an annual wellness visit in 12 months.   The tumor was a stage I cancer. If CT scans show no other disease, no chemotherapy is recommended.  The prognosis of a stage I granulosa cell tumor is high (90-95% cure rate with surgery alone)

## 2018-11-11 ENCOUNTER — Encounter (HOSPITAL_COMMUNITY): Payer: Self-pay

## 2018-11-11 ENCOUNTER — Ambulatory Visit (HOSPITAL_COMMUNITY)
Admission: RE | Admit: 2018-11-11 | Discharge: 2018-11-11 | Disposition: A | Discharge status: Home / Self Care | Payer: PRIVATE HEALTH INSURANCE | Source: Ambulatory Visit | Attending: Gynecologic Oncology | Admitting: Gynecologic Oncology

## 2018-11-11 ENCOUNTER — Other Ambulatory Visit: Payer: Self-pay

## 2018-11-11 DIAGNOSIS — D3912 Neoplasm of uncertain behavior of left ovary: Secondary | ICD-10-CM

## 2018-11-11 HISTORY — DX: Malignant (primary) neoplasm, unspecified: C80.1

## 2018-11-11 MED ORDER — IOHEXOL 300 MG/ML  SOLN
100.0000 mL | Freq: Once | INTRAMUSCULAR | Status: AC | PRN
  Administered 2018-11-11: 100 mL via INTRAVENOUS

## 2018-11-11 MED ORDER — SODIUM CHLORIDE (PF) 0.9 % IJ SOLN
INTRAMUSCULAR | Status: AC
  Filled 2018-11-11: qty 50

## 2018-11-16 ENCOUNTER — Telehealth: Payer: Self-pay

## 2018-11-16 NOTE — Telephone Encounter (Signed)
I spoke with Ms Urciuoli this am.  I gave her the ct scan results per Dr. Denman George. There is a small soft tissue focus in the left adnexa.  I told Ms Saintvil Dr. Denman George feels this is scar tissue.  Dr. Denman George recommends another ct scan in 6 months.  Ms. Wease verbalized understanding.

## 2019-02-22 ENCOUNTER — Telehealth: Payer: Self-pay | Admitting: *Deleted

## 2019-02-22 ENCOUNTER — Other Ambulatory Visit: Payer: Self-pay | Admitting: Gynecologic Oncology

## 2019-02-22 DIAGNOSIS — R1907 Generalized intra-abdominal and pelvic swelling, mass and lump: Secondary | ICD-10-CM

## 2019-02-22 DIAGNOSIS — D3912 Neoplasm of uncertain behavior of left ovary: Secondary | ICD-10-CM

## 2019-02-22 NOTE — Telephone Encounter (Signed)
Called and left the patient a message to call the office. Patient needs a lab/MD appt end of March; and to pick up constract

## 2019-02-22 NOTE — Progress Notes (Signed)
CT ordered as follow up on soft tissue area noted in the left pelvis. Hx granulosa cell tumor of the left ovary.

## 2019-04-13 ENCOUNTER — Telehealth: Payer: Self-pay | Admitting: *Deleted

## 2019-04-13 NOTE — Telephone Encounter (Signed)
Patient called and requested if her CT scan can be at [place] at [facility]. Scan moved to [facility] for 3/22 at 11:30am with a 9:30 am arrive. Order faxed and patient aware

## 2019-04-30 ENCOUNTER — Other Ambulatory Visit: Payer: Self-pay

## 2019-04-30 ENCOUNTER — Inpatient Hospital Stay: Payer: PRIVATE HEALTH INSURANCE | Attending: Gynecologic Oncology

## 2019-04-30 DIAGNOSIS — Z90710 Acquired absence of both cervix and uterus: Secondary | ICD-10-CM | POA: Diagnosis not present

## 2019-04-30 DIAGNOSIS — F17210 Nicotine dependence, cigarettes, uncomplicated: Secondary | ICD-10-CM | POA: Insufficient documentation

## 2019-04-30 DIAGNOSIS — E119 Type 2 diabetes mellitus without complications: Secondary | ICD-10-CM | POA: Diagnosis not present

## 2019-04-30 DIAGNOSIS — Z9079 Acquired absence of other genital organ(s): Secondary | ICD-10-CM | POA: Diagnosis not present

## 2019-04-30 DIAGNOSIS — Z7984 Long term (current) use of oral hypoglycemic drugs: Secondary | ICD-10-CM | POA: Diagnosis not present

## 2019-04-30 DIAGNOSIS — Z90722 Acquired absence of ovaries, bilateral: Secondary | ICD-10-CM | POA: Insufficient documentation

## 2019-04-30 DIAGNOSIS — Z8543 Personal history of malignant neoplasm of ovary: Secondary | ICD-10-CM | POA: Insufficient documentation

## 2019-04-30 DIAGNOSIS — Z791 Long term (current) use of non-steroidal anti-inflammatories (NSAID): Secondary | ICD-10-CM | POA: Diagnosis not present

## 2019-04-30 DIAGNOSIS — Z803 Family history of malignant neoplasm of breast: Secondary | ICD-10-CM | POA: Diagnosis not present

## 2019-04-30 DIAGNOSIS — D3912 Neoplasm of uncertain behavior of left ovary: Secondary | ICD-10-CM

## 2019-04-30 LAB — BASIC METABOLIC PANEL
Anion gap: 11 (ref 5–15)
BUN: 11 mg/dL (ref 6–20)
CO2: 21 mmol/L — ABNORMAL LOW (ref 22–32)
Calcium: 9.3 mg/dL (ref 8.9–10.3)
Chloride: 106 mmol/L (ref 98–111)
Creatinine, Ser: 0.84 mg/dL (ref 0.44–1.00)
GFR calc Af Amer: >60 mL/min (ref >60)
GFR calc non Af Amer: >60 mL/min (ref >60)
Glucose, Bld: 104 mg/dL — ABNORMAL HIGH (ref 70–99)
Potassium: 4.2 mmol/L (ref 3.5–5.1)
Sodium: 138 mmol/L (ref 135–145)

## 2019-05-03 ENCOUNTER — Ambulatory Visit
Admission: RE | Admit: 2019-05-03 | Discharge: 2019-05-03 | Disposition: A | Discharge status: Home / Self Care | Payer: Self-pay | Source: Ambulatory Visit | Attending: Gynecologic Oncology | Admitting: Gynecologic Oncology

## 2019-05-03 ENCOUNTER — Other Ambulatory Visit: Payer: Self-pay | Admitting: Oncology

## 2019-05-03 ENCOUNTER — Ambulatory Visit (HOSPITAL_COMMUNITY): Payer: PRIVATE HEALTH INSURANCE

## 2019-05-03 DIAGNOSIS — N801 Endometriosis of ovary: Secondary | ICD-10-CM

## 2019-05-03 DIAGNOSIS — D3912 Neoplasm of uncertain behavior of left ovary: Secondary | ICD-10-CM

## 2019-05-03 DIAGNOSIS — N80129 Deep endometriosis of ovary, unspecified ovary: Secondary | ICD-10-CM

## 2019-05-05 ENCOUNTER — Other Ambulatory Visit: Payer: Self-pay

## 2019-05-05 ENCOUNTER — Inpatient Hospital Stay: Payer: PRIVATE HEALTH INSURANCE

## 2019-05-05 ENCOUNTER — Encounter: Payer: Self-pay | Admitting: Gynecologic Oncology

## 2019-05-05 ENCOUNTER — Inpatient Hospital Stay (HOSPITAL_BASED_OUTPATIENT_CLINIC_OR_DEPARTMENT_OTHER): Payer: PRIVATE HEALTH INSURANCE | Admitting: Gynecologic Oncology

## 2019-05-05 VITALS — BP 106/68 | HR 90 | Temp 98.9°F | Resp 18 | Ht 65.5 in | Wt 225.0 lb

## 2019-05-05 DIAGNOSIS — Z90722 Acquired absence of ovaries, bilateral: Secondary | ICD-10-CM

## 2019-05-05 DIAGNOSIS — D3912 Neoplasm of uncertain behavior of left ovary: Secondary | ICD-10-CM

## 2019-05-05 DIAGNOSIS — Z8543 Personal history of malignant neoplasm of ovary: Secondary | ICD-10-CM

## 2019-05-05 DIAGNOSIS — Z9079 Acquired absence of other genital organ(s): Secondary | ICD-10-CM | POA: Diagnosis not present

## 2019-05-05 DIAGNOSIS — Z90710 Acquired absence of both cervix and uterus: Secondary | ICD-10-CM | POA: Diagnosis not present

## 2019-05-05 DIAGNOSIS — E119 Type 2 diabetes mellitus without complications: Secondary | ICD-10-CM

## 2019-05-05 LAB — HEMOGLOBIN A1C
Hgb A1c MFr Bld: 6.1 % — ABNORMAL HIGH (ref 4.8–5.6)
Mean Plasma Glucose: 128.37 mg/dL

## 2019-05-05 NOTE — Patient Instructions (Signed)
Please notify Dr Denman George at phone number [phone] if you notice vaginal bleeding, new pelvic or abdominal pains, bloating, feeling full easy, or a change in bladder or bowel function.   Please contact Dr Serita Grit office (at [phone]) in June to request an appointment with her for September, 2021.  She will check your tumor markers and HbA1c today.

## 2019-05-05 NOTE — Progress Notes (Signed)
Follow-up Note: Gyn-Onc  Consult was requested by Dr. Helane Rima for the evaluation of [name] 39 y.o. female  CC:  Chief Complaint  Patient presents with  . Granulosa cell tumor of left ovary    Follow up    Assessment/Plan:  [name]  is a 39 y.o.  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  No evidence of disease recurrence.  We will obtain tumor markers today and again in 6 months.  It is safe to continue estrogen replacement.  She will have HbA1c drawn today for her history of diabetes.   HPI: Ms [name] is a 39 year old woman who is seen in consultation at the request of Dr Helane Rima for evaluation of granulosa cell tumor of the ovary.   The patient has a longstanding history of ovarian cysts.  She had a cyst that had been observed since May 2019 although it had potentially gone away in the summer 2019 but then a new cyst that developed in March 2020.  When the cyst developed more complex findings on an ultrasound scan on July 30, 2018 in which it measured 4.2 x 3.1 cm and 3.5 x 2 cm with solid areas and complexity.  A decision was made to proceed with surgical excision.  As she had had a complex gynecologic history and infertility, with PCOS, she made a decision for a total abdominal hysterectomy and BSO.  Dr. Helane Rima performed a total abdominal hysterectomy and BSO at [facility] on October 20, 2018.  Review of the operative note confirms that there was no gross gross extraovarian disease identified.  There was a left adnexal mass that was visualized.  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  It was an adult type tumor.  Washings had not been obtained.  The right ovary was benign.  She was a side stage Ia pNX FIGO stage Ia granulosa cell tumor of the left  ovary.  Inhibin B was drawn immediately postoperatively on October 21, 2018 and was normal at 10.5.  A Ca1 25 had been drawn on Jul 02, 2018 and was normal at 18.7.  Postoperatively she did well with no significant complications.  This patient's gynecologic history is complex for infertility and polycystic ovarian disease, and a long history of assisted reproductive technology including extended Clomid use.  She had had a twin pregnancy in 2008 which ended with a premature cesarean section and ultimate demise of the twins.  She also had a midtrimester D and E for loss of a desired pregnancy.  Unfortunately she never was able to carry a pregnancy to viability.  Her past surgical history is remarkable for a cesarean section, cervical cerclage, cholecystectomy, D&C in 2018, and a total abdominal hysterectomy with BSO.  Family history significant for maternal uncle with history of a tumor in his chest possible Hodgkin's lymphoma.  She is a great aunt who had breast cancer at age 49, and a great grandmother who had a cancer at age 37.  Interval Hx:  On November 11, 2018 she underwent a post surgical staging CT of the abdomen and pelvis with contrast.  This identified a small 2 cm focus of soft tissue in the left adnexa which was indeterminate.  It was potentially either a small benign focus of postsurgical change or residual  recurrent tumor and therefore close imaging was recommended.  She had no lymphadenopathy or other findings concerning for persistent or recurrent disease.  Repeat CT scan on May 03, 2019 showed a normal bladder and vaginal cuff status post hysterectomy with no pelvic mass, adenopathy or fluid collection.  While this scan was performed at [facility] separate from the prior scan which had been through current health, there were no findings concerning for persistence of the soft tissue focus in the left adnexa.  She has been trying to wean off Metformin for her diabetes  and is attempting to lose weight.  She been started on estrogen replacement therapy for surgical menopause symptoms.  Current Meds:  Outpatient Encounter Medications as of 05/05/2019  Medication Sig  . acetaminophen (TYLENOL) 500 MG tablet Take 500-1,000 mg by mouth every 6 (six) hours as needed (for pain.).  Marland Kitchen calcium carbonate (TUMS - DOSED IN MG ELEMENTAL CALCIUM) 500 MG chewable tablet Chew 2 tablets by mouth 2 (two) times daily as needed for indigestion or heartburn.  Marland Kitchen ibuprofen (ADVIL) 600 MG tablet Take 1 tablet (600 mg total) by mouth every 6 (six) hours as needed for mild pain or moderate pain.  . metFORMIN (GLUCOPHAGE) 500 MG tablet Take 500-1,000 mg by mouth See admin instructions. Patient takes 500 mg 3 x daily  . Multiple Vitamin (MULTIVITAMIN WITH MINERALS) TABS tablet Take 1 tablet by mouth at bedtime.   No facility-administered encounter medications on file as of 05/05/2019.    Allergy:  Allergies  Allergen Reactions  . Sulfa Antibiotics Hives and Rash  . Oxycodone Hcl Nausea Only    Social Hx:   Social History   Socioeconomic History  . Marital status: Married    Spouse name: Not on file  . Number of children: Not on file  . Years of education: Not on file  . Highest education level: Not on file  Occupational History  . Not on file  Tobacco Use  . Smoking status: Current Every Day Smoker    Packs/day: 0.25    Types: Cigarettes  . Smokeless tobacco: Never Used  Substance and Sexual Activity  . Alcohol use: Yes    Comment: occasionally  . Drug use: No  . Sexual activity: Not Currently    Birth control/protection: None  Other Topics Concern  . Not on file  Social History Narrative  . Not on file   Social Determinants of Health   Financial Resource Strain:   . Difficulty of Paying Living Expenses:   Food Insecurity:   . Worried About Charity fundraiser in the Last Year:   . Arboriculturist in the Last Year:   Transportation Needs:   . Lexicographer (Medical):   Marland Kitchen Lack of Transportation (Non-Medical):   Physical Activity:   . Days of Exercise per Week:   . Minutes of Exercise per Session:   Stress:   . Feeling of Stress :   Social Connections:   . Frequency of Communication with Friends and Family:   . Frequency of Social Gatherings with Friends and Family:   . Attends Religious Services:   . Active Member of Clubs or Organizations:   . Attends Archivist Meetings:   Marland Kitchen Marital Status:   Intimate Partner Violence:   . Fear of Current or Ex-Partner:   . Emotionally Abused:   Marland Kitchen Physically Abused:   . Sexually Abused:     Past Surgical Hx:  Past Surgical History:  Procedure Laterality Date  . ABDOMINAL HYSTERECTOMY  10/20/2018  . CERVICAL CERCLAGE    . CESAREAN SECTION    . CHOLECYSTECTOMY    . DILATION AND EVACUATION N/A 04/30/2016   Procedure: DILATATION AND EVACUATION;  Surgeon: Dian Queen, MD;  Location: [place];  Service: Gynecology;  Laterality: N/A;  . HYSTERECTOMY ABDOMINAL WITH SALPINGO-OOPHORECTOMY Bilateral 10/20/2018   Procedure: HYSTERECTOMY ABDOMINAL WITH SALPINGO-OOPHORECTOMY, Lysis of Adhesions;  Surgeon: Dian Queen, MD;  Location: [place];  Service: Gynecology;  Laterality: Bilateral;    Past Medical Hx:  Past Medical History:  Diagnosis Date  . Anemia    history with pregnancy  . Complication of anesthesia    difficult time waking up after gallbladder surgery, blood pressure drop with C section after babies delivered  . GERD (gastroesophageal reflux disease)   . ovarian ca dx'd 10/21/2018  . PCOS (polycystic ovarian syndrome)   . PONV (postoperative nausea and vomiting)   . Pre-diabetes    Unsure, A1C checked since on Metformin for PCOS--metformin has been increased, no official diagnosis of pre-DM  . Seasonal allergies     Past Gynecological History:  See HPi Patient's last menstrual period was 01/02/2016.  Family Hx: History reviewed. No pertinent family  history.  Review of Systems:  Constitutional  Feels well,    ENT Normal appearing ears and nares bilaterally Skin/Breast  No rash, sores, jaundice, itching, dryness Cardiovascular  No chest pain, shortness of breath, or edema  Pulmonary  No cough or wheeze.  Gastro Intestinal  No nausea, vomitting, or diarrhoea. No bright red blood per rectum, no abdominal pain, change in bowel movement, or constipation.  Genito Urinary  No frequency, urgency, dysuria, no bleeding Musculo Skeletal  No myalgia, arthralgia, joint swelling or pain  Neurologic  No weakness, numbness, change in gait,  Psychology  No depression, anxiety, insomnia.   Vitals:  Blood pressure 106/68, pulse 90, temperature 98.9 F (37.2 C), temperature source Temporal, resp. rate 18, height 5' 5.5" (1.664 m), weight 225 lb (102.1 kg), last menstrual period 01/02/2016, SpO2 100 %.  Physical Exam: WD in NAD Neck  Supple NROM, without any enlargements.  Lymph Node Survey No cervical supraclavicular or inguinal adenopathy Cardiovascular  Pulse normal rate, regularity and rhythm. S1 and S2 normal.  Lungs  Clear to auscultation bilateraly, without wheezes/crackles/rhonchi. Good air movement.  Skin  No rash/lesions/breakdown  Psychiatry  Alert and oriented to person, place, and time  Abdomen  Normoactive bowel sounds, abdomen soft, non-tender and obese without evidence of hernia. Back No CVA tenderness Genito Urinary  Smooth vaginal cuff, no masses, no lesions Rectal  deferred Extremities  No bilateral cyanosis, clubbing or edema.   Thereasa Solo, MD  05/05/2019, 3:58 PM

## 2019-05-11 LAB — INHIBIN B: Inhibin B: 8.3 pg/mL

## 2019-05-11 LAB — ANTI MULLERIAN HORMONE: ANTI-MULLERIAN HORMONE (AMH): <0.015 ng/mL

## 2019-05-12 ENCOUNTER — Telehealth: Payer: Self-pay | Admitting: *Deleted

## 2019-05-12 NOTE — Telephone Encounter (Signed)
TC to patient, informed her tumor markers are normal per M. Cross NP.  Pt verbalizes understanding.

## 2019-12-31 IMAGING — CT CT ABD-PELV W/ CM
2 of 4 series · 16 of 46 positions shown, 18 images · IV contrast (OMNIPAQUE)
Comparison: None.

CLINICAL DATA: Granulosa cell tumor of the left ovary identified on
TAHBSO performed 10/20/2018. Staging evaluation.

EXAM:
CT ABDOMEN AND PELVIS WITH CONTRAST
TECHNIQUE: Multidetector CT imaging of the abdomen and pelvis was performed
using the standard protocol following bolus administration of
intravenous contrast.
CONTRAST:  100mL OMNIPAQUE IOHEXOL 300 MG/ML  SOLN

[Series 2: axial st · axial · 0.98mm/px · z∈[-745,-285]mm · 13 of 106 slices shown, 15 images]
[im 7/106  soft-tissue]
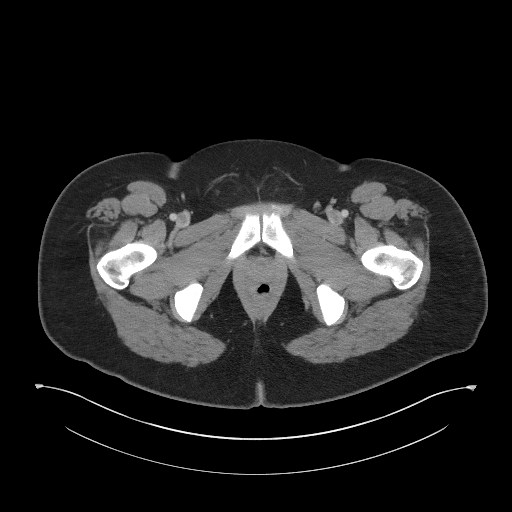
[im 7/106  bone]
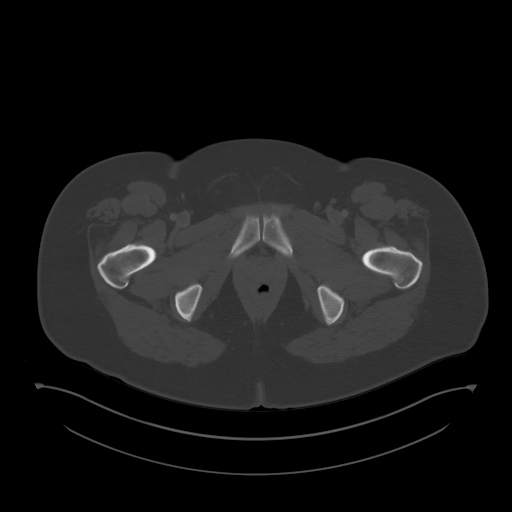
[im 13/106  soft-tissue]
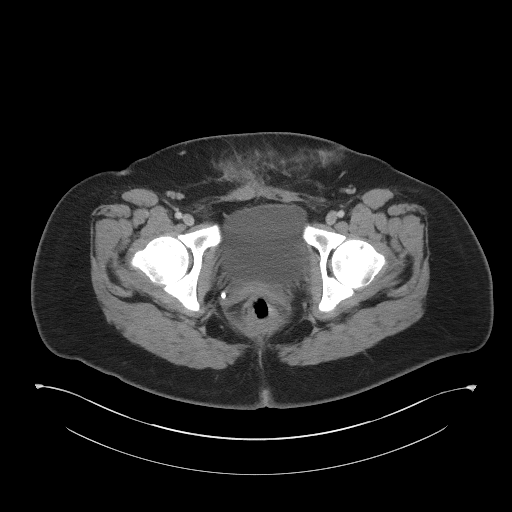
[im 25/106  soft-tissue]
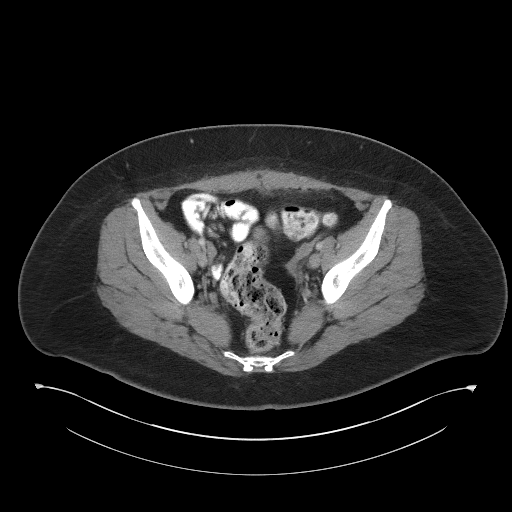
[im 31/106  soft-tissue]
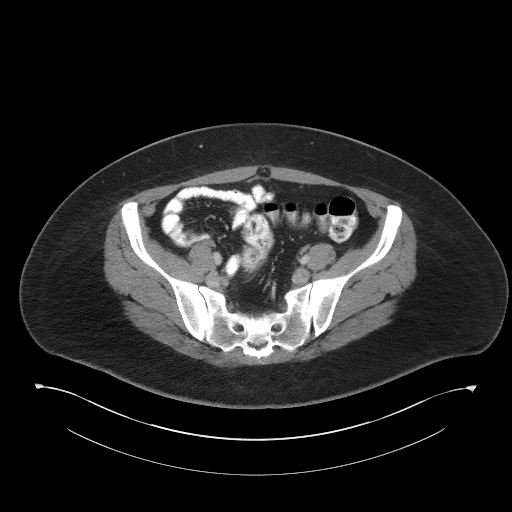
[im 38/106  soft-tissue]
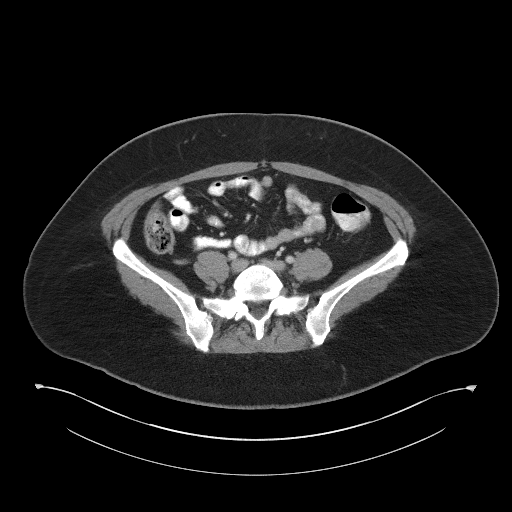
[im 44/106  soft-tissue]
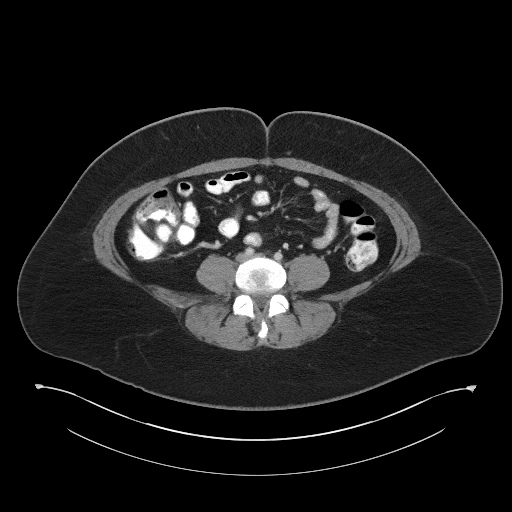
[im 56/106  soft-tissue]
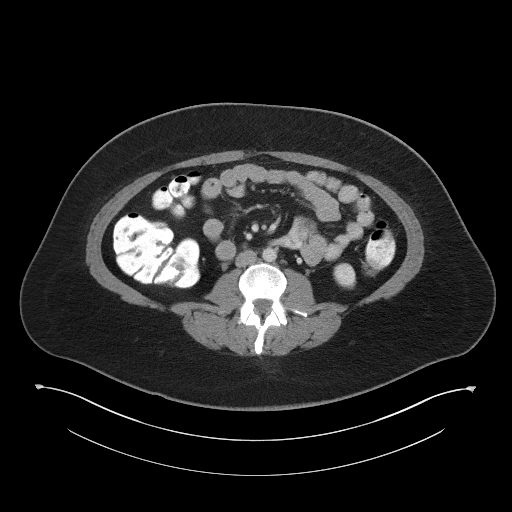
[im 62/106  soft-tissue]
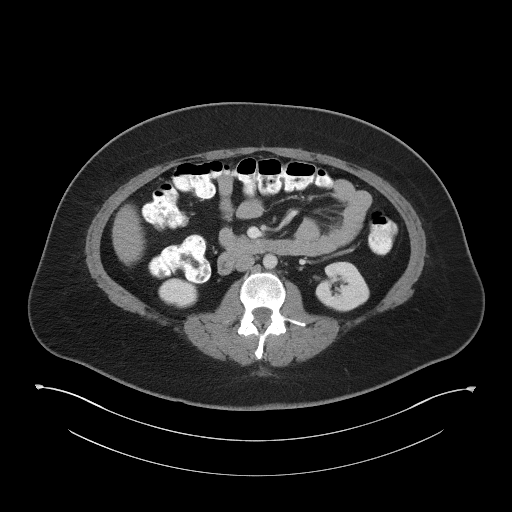
[im 68/106  soft-tissue]
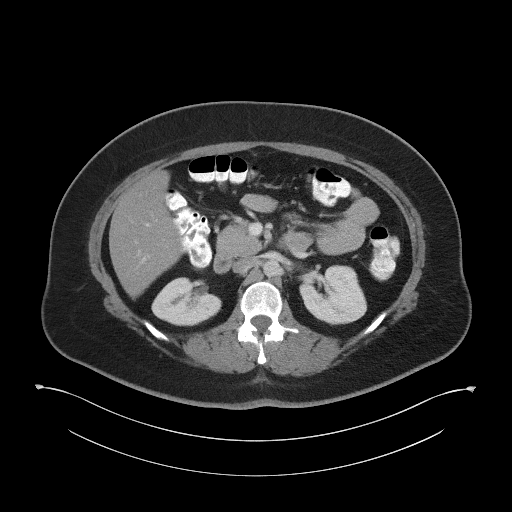
[im 68/106  bone]
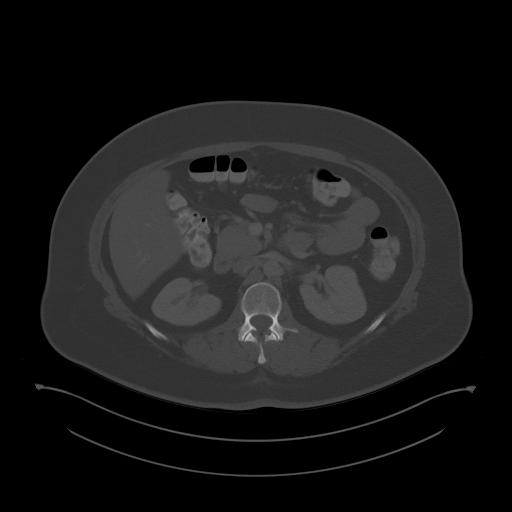
[im 75/106  soft-tissue]
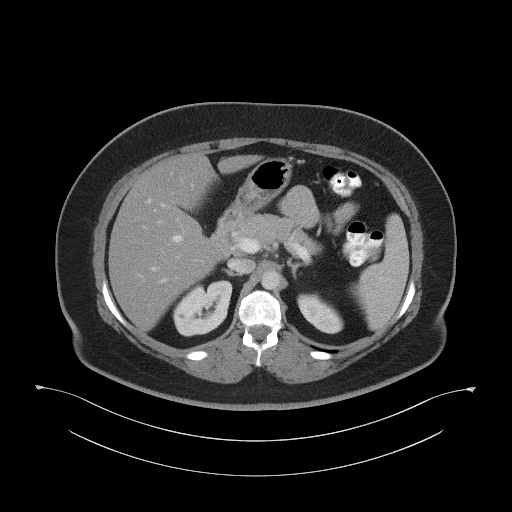
[im 81/106  soft-tissue]
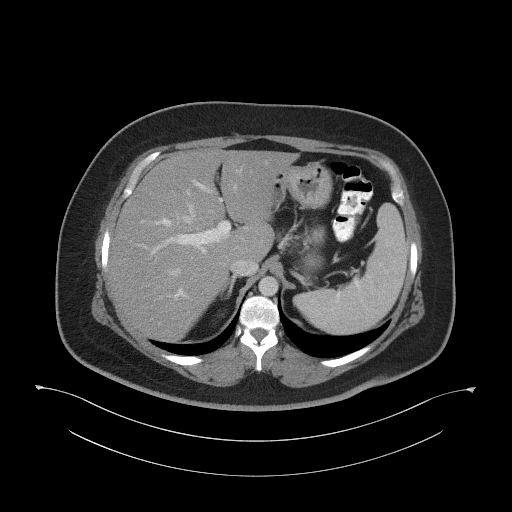
[im 93/106  soft-tissue]
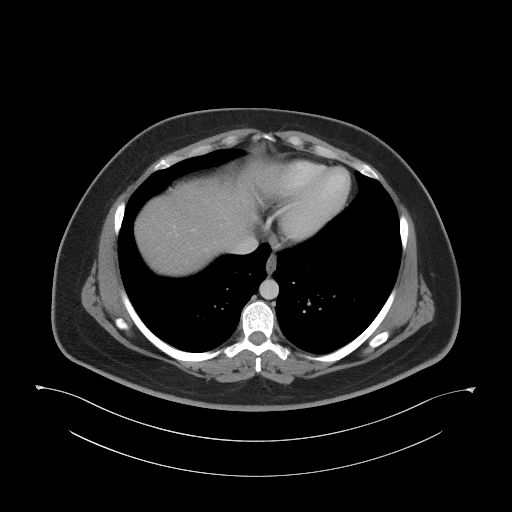
[im 99/106  soft-tissue]
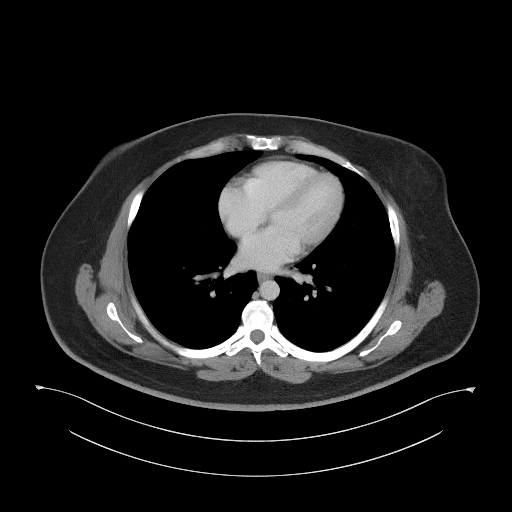

[Series 4: coronal st · coronal · 0.82mm/px · 3 of 84 slices shown]
[im 28/84  soft-tissue]
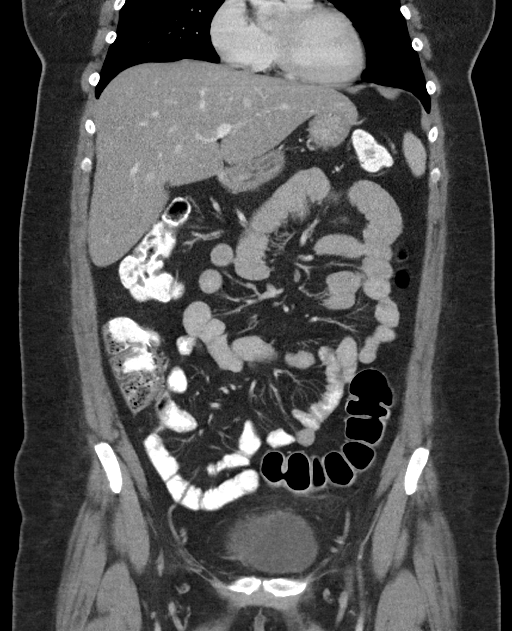
[im 37/84  soft-tissue]
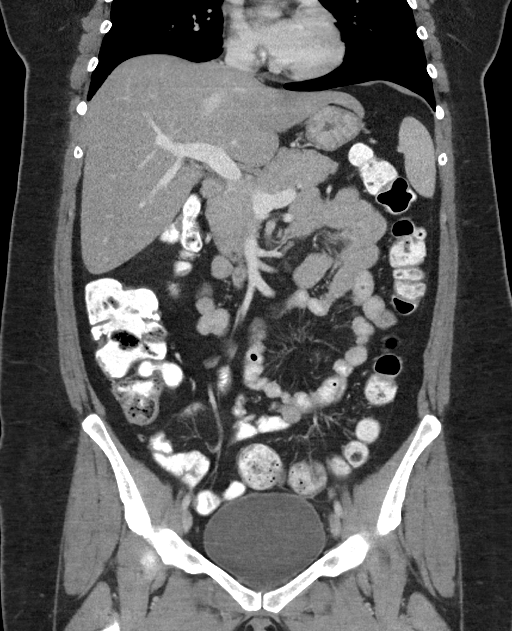
[im 47/84  soft-tissue]
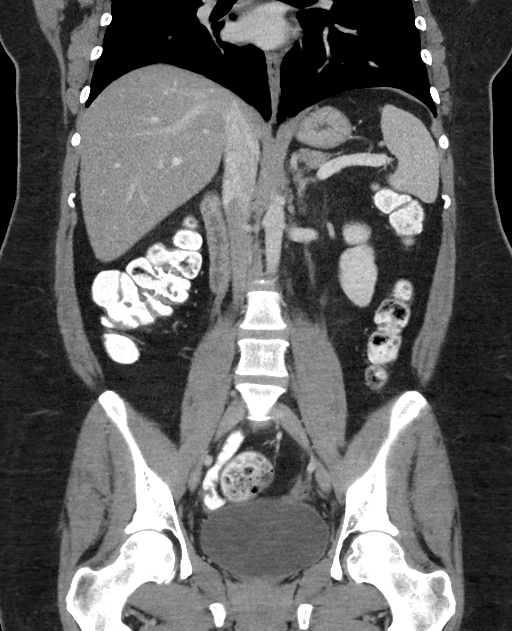

[16 of 46 positions shown; findings below may reference images not displayed]

FINDINGS: Lower chest: No significant pulmonary nodules or acute consolidative
airspace disease.

Hepatobiliary: Normal liver size. Suggestion of diffuse hepatic
steatosis. No liver masses. No definite liver surface irregularity.
Cholecystectomy. No biliary ductal dilatation.

Pancreas: Normal, with no mass or duct dilation.

Spleen: Normal size. No mass.

Adrenals/Urinary Tract: Normal adrenals. Normal size kidneys.
Symmetric normal contrast nephrograms. Subcentimeter hypodense renal
cortical lesion in the upper right kidney is too small to
characterize and requires no follow-up. No additional renal lesions.
No hydronephrosis. Normal bladder.

Stomach/Bowel: Normal non-distended stomach. Normal caliber small
bowel with no small bowel wall thickening. Normal appendix. Oral
contrast transits to the colon. Normal large bowel with no
diverticulosis, large bowel wall thickening or pericolonic fat
stranding.

Vascular/Lymphatic: Normal caliber abdominal aorta. Patent portal,
splenic, hepatic and renal veins. No pathologically enlarged lymph
nodes in the abdomen or pelvis.

Reproductive: Status post hysterectomy. No abnormal findings at
hysterectomy margin. There is an asymmetric small 2.1 x 1.6 cm soft
tissue focus in the left adnexa (series 2/image 80).

Other: No pneumoperitoneum, ascites or focal fluid collection.
Expected subcutaneous fat stranding at the transverse laparotomy
site in the ventral pelvis. Otherwise no soft tissue foci in the
peritoneum.

Musculoskeletal: No aggressive appearing focal osseous lesions.
IMPRESSION: 1. Asymmetric small 2.1 cm soft tissue focus in the left adnexa,
indeterminate. While potentially a small benign focus of
postsurgical change, close imaging surveillance is recommended to
exclude residual/recurrent tumor.
2. No lymphadenopathy or other findings suspicious for metastatic
disease in the abdomen or pelvis.

## 2020-04-12 ENCOUNTER — Telehealth: Payer: Self-pay | Admitting: *Deleted

## 2020-04-12 NOTE — Telephone Encounter (Signed)
Returned the patient's call and scheduled a follow up appt for 3/8

## 2020-04-17 ENCOUNTER — Encounter: Payer: Self-pay | Admitting: Gynecologic Oncology

## 2020-04-18 ENCOUNTER — Encounter: Payer: Self-pay | Admitting: Gynecologic Oncology

## 2020-04-18 ENCOUNTER — Other Ambulatory Visit: Payer: Self-pay

## 2020-04-18 ENCOUNTER — Inpatient Hospital Stay: Payer: PRIVATE HEALTH INSURANCE | Attending: Gynecologic Oncology | Admitting: Gynecologic Oncology

## 2020-04-18 VITALS — BP 112/85 | HR 82 | Temp 97.6°F | Resp 17 | Ht 65.5 in | Wt 233.5 lb

## 2020-04-18 DIAGNOSIS — F17210 Nicotine dependence, cigarettes, uncomplicated: Secondary | ICD-10-CM | POA: Diagnosis not present

## 2020-04-18 DIAGNOSIS — Z90710 Acquired absence of both cervix and uterus: Secondary | ICD-10-CM | POA: Diagnosis not present

## 2020-04-18 DIAGNOSIS — Z90722 Acquired absence of ovaries, bilateral: Secondary | ICD-10-CM | POA: Diagnosis not present

## 2020-04-18 DIAGNOSIS — D3912 Neoplasm of uncertain behavior of left ovary: Secondary | ICD-10-CM | POA: Diagnosis not present

## 2020-04-18 MED ORDER — ESTRADIOL 2 MG PO TABS: 1.0000 mg | ORAL_TABLET | Freq: Every day | ORAL | 11 refills | Status: DC

## 2020-04-18 NOTE — Patient Instructions (Addendum)
Dr Denman George has doubled your dose of estradiol (estrogen medication) to help with hotflashes.  Estrogen tablets can increase your risk for blood clots, strokes and heart attacks (particularly if you are a smoker or have high blood pressure).  Please return to see Dr Helane Rima in the fall.  Please contact Dr Serita Grit office (at [phone]) in October, 2022 to request an appointment with her for March, 2023. Please notify the office that you will need a lab appointment before that visit for checking of the "inhibin B and anti-mullerian hormone".

## 2020-04-18 NOTE — Progress Notes (Signed)
Follow-up Note: Gyn-Onc  Consult was requested by Dr. Helane Rima for the evaluation of [name] 40 y.o. female  CC:  Chief Complaint  Patient presents with  . Granulosa cell tumor of left ovary    Assessment/Plan:  Karen. [name]  is a 40 y.o.  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  No evidence of disease recurrence.  We will see her again in 12 months. She will see Dr Helane Rima in 6 months.   It is safe to continue estrogen replacement. We will increase the dose of estrogen replacement to 2mg  given her symptoms.   HPI: [name] is a 40 year old woman who is seen in consultation at the request of Dr Helane Rima for evaluation of granulosa cell tumor of the ovary.   The patient has a longstanding history of ovarian cysts.  She had a cyst that had been observed since May 2019 although it had potentially gone away in the summer 2019 but then a new cyst that developed in March 2020.  When the cyst developed more complex findings on an ultrasound scan on July 30, 2018 in which it measured 4.2 x 3.1 cm and 3.5 x 2 cm with solid areas and complexity.  A decision was made to proceed with surgical excision.  As she had had a complex gynecologic history and infertility, with PCOS, she made a decision for a total abdominal hysterectomy and BSO.  Dr. Helane Rima performed a total abdominal hysterectomy and BSO at [facility] on October 20, 2018.  Review of the operative note confirms that there was no gross gross extraovarian disease identified.  There was a left adnexal mass that was visualized.  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  It was an adult type tumor.  Washings had not been obtained.  The right ovary was benign.  She was a side stage Ia pNX FIGO stage Ia granulosa cell tumor of the left  ovary.  Inhibin B was drawn immediately postoperatively on October 21, 2018 and was normal at 10.5.  A Ca1 25 had been drawn on Jul 02, 2018 and was normal at 18.7.  Postoperatively she did well with no significant complications.  This patient's gynecologic history is complex for infertility and polycystic ovarian disease, and a long history of assisted reproductive technology including extended Clomid use.  She had had a twin pregnancy in 2008 which ended with a premature cesarean section and ultimate demise of the twins.  She also had a midtrimester D and E for loss of a desired pregnancy.  Unfortunately she never was able to carry a pregnancy to viability.  Her past surgical history is remarkable for a cesarean section, cervical cerclage, cholecystectomy, D&C in 2018, and a total abdominal hysterectomy with BSO.  Family history significant for maternal uncle with history of a tumor in his chest possible Hodgkin's lymphoma.  She is a great aunt who had breast cancer at age 73, and a great grandmother who had a cancer at age 63.  Interval Hx:  On November 11, 2018 she underwent a post surgical staging CT of the abdomen and pelvis with contrast.  This identified a small 2 cm focus of soft tissue in the left adnexa which was indeterminate.  It was potentially either a small benign focus of postsurgical change  or residual recurrent tumor and therefore close imaging was recommended.  She had no lymphadenopathy or other findings concerning for persistent or recurrent disease.  Repeat CT scan on May 03, 2019 showed a normal bladder and vaginal cuff status post hysterectomy with no pelvic mass, adenopathy or fluid collection.  While this scan was performed at [facility] separate from the prior scan which had been through current health, there were no findings concerning for persistence of the soft tissue focus in the left adnexa.  She been started on estrogen replacement therapy for  surgical menopause symptoms. However reported worsening symptoms of menopause (hot flashes, weight gain).   IAntimullerian hormone on 05/05/19 was normal at 8.3. Inhibin B on 05/05/19 was normal at 8.3.   Current Meds:  Outpatient Encounter Medications as of 04/18/2020  Medication Sig  . acetaminophen (TYLENOL) 500 MG tablet Take 500-1,000 mg by mouth every 6 (six) hours as needed (for pain.).  Marland Kitchen ibuprofen (ADVIL) 600 MG tablet Take 1 tablet (600 mg total) by mouth every 6 (six) hours as needed for mild pain or moderate pain. (Patient taking differently: Take 200 mg by mouth every 8 (eight) hours as needed for mild pain or moderate pain.)  . metFORMIN (GLUCOPHAGE) 500 MG tablet Take 500 mg by mouth 2 (two) times daily with a meal.  . Multiple Vitamin (MULTIVITAMIN WITH MINERALS) TABS tablet Take 1 tablet by mouth at bedtime.  . [DISCONTINUED] estradiol (ESTRACE) 1 MG tablet Take 1 mg by mouth daily.  . calcium carbonate (TUMS - DOSED IN MG ELEMENTAL CALCIUM) 500 MG chewable tablet Chew 2 tablets by mouth 2 (two) times daily as needed for indigestion or heartburn. (Patient not taking: Reported on 04/17/2020)  . estradiol (ESTRACE) 2 MG tablet Take 0.5 tablets (1 mg total) by mouth daily.   No facility-administered encounter medications on file as of 04/18/2020.    Allergy:  Allergies  Allergen Reactions  . Sulfa Antibiotics Hives and Rash  . Oxycodone Hcl Nausea Only    Social Hx:   Social History   Socioeconomic History  . Marital status: Married    Spouse name: Not on file  . Number of children: Not on file  . Years of education: Not on file  . Highest education level: Not on file  Occupational History  . Not on file  Tobacco Use  . Smoking status: Current Every Day Smoker    Packs/day: 0.25    Types: Cigarettes  . Smokeless tobacco: Never Used  Vaping Use  . Vaping Use: Never used  Substance and Sexual Activity  . Alcohol use: Yes    Comment: occasionally  . Drug use: No  .  Sexual activity: Not Currently    Birth control/protection: None  Other Topics Concern  . Not on file  Social History Narrative  . Not on file   Social Determinants of Health   Financial Resource Strain: Not on file  Food Insecurity: Not on file  Transportation Needs: Not on file  Physical Activity: Not on file  Stress: Not on file  Social Connections: Not on file  Intimate Partner Violence: Not on file    Past Surgical Hx:  Past Surgical History:  Procedure Laterality Date  . ABDOMINAL HYSTERECTOMY  10/20/2018  . CERVICAL CERCLAGE    . CESAREAN SECTION    . CHOLECYSTECTOMY    . DILATION AND EVACUATION N/A 04/30/2016   Procedure: DILATATION AND EVACUATION;  Surgeon: Dian Queen, MD;  Location: [place];  Service:  Gynecology;  Laterality: N/A;  . HYSTERECTOMY ABDOMINAL WITH SALPINGO-OOPHORECTOMY Bilateral 10/20/2018   Procedure: HYSTERECTOMY ABDOMINAL WITH SALPINGO-OOPHORECTOMY, Lysis of Adhesions;  Surgeon: Dian Queen, MD;  Location: [place];  Service: Gynecology;  Laterality: Bilateral;    Past Medical Hx:  Past Medical History:  Diagnosis Date  . Anemia    history with pregnancy  . Complication of anesthesia    difficult time waking up after gallbladder surgery, blood pressure drop with C section after babies delivered  . GERD (gastroesophageal reflux disease)   . ovarian ca dx'd 10/21/2018  . PCOS (polycystic ovarian syndrome)   . PONV (postoperative nausea and vomiting)   . Pre-diabetes    Unsure, A1C checked since on Metformin for PCOS--metformin has been increased, no official diagnosis of pre-DM  . Seasonal allergies     Past Gynecological History:  See HPi Patient's last menstrual period was 01/02/2016.  Family Hx: History reviewed. No pertinent family history.  Review of Systems:  Constitutional  Feels well,    ENT Normal appearing ears and nares bilaterally Skin/Breast  No rash, sores, jaundice, itching, dryness Cardiovascular  No  chest pain, shortness of breath, or edema  Pulmonary  No cough or wheeze.  Gastro Intestinal  No nausea, vomitting, or diarrhoea. No bright red blood per rectum, no abdominal pain, change in bowel movement, or constipation.  Genito Urinary  No frequency, urgency, dysuria, no bleeding Musculo Skeletal  No myalgia, arthralgia, joint swelling or pain  Neurologic  No weakness, numbness, change in gait,  Psychology  No depression, anxiety, insomnia.   Vitals:  Blood pressure 112/85, pulse 82, temperature 97.6 F (36.4 C), temperature source Tympanic, resp. rate 17, height 5' 5.5" (1.664 m), weight 233 lb 7.5 oz (105.9 kg), last menstrual period 01/02/2016, SpO2 100 %.  Physical Exam: WD in NAD Neck  Supple NROM, without any enlargements.  Lymph Node Survey No cervical supraclavicular or inguinal adenopathy Cardiovascular  Pulse normal rate, regularity and rhythm. S1 and S2 normal.  Lungs  Clear to auscultation bilateraly, without wheezes/crackles/rhonchi. Good air movement.  Skin  No rash/lesions/breakdown  Psychiatry  Alert and oriented to person, place, and time  Abdomen  Normoactive bowel sounds, abdomen soft, non-tender and obese without evidence of hernia. Back No CVA tenderness Genito Urinary  Smooth vaginal cuff, no masses, no lesions Rectal  deferred Extremities  No bilateral cyanosis, clubbing or edema.   Thereasa Solo, MD  04/18/2020, 3:31 PM

## 2020-04-19 ENCOUNTER — Telehealth: Payer: Self-pay

## 2020-04-19 MED ORDER — ESTRADIOL 2 MG PO TABS: 2.0000 mg | ORAL_TABLET | Freq: Every day | ORAL | 10 refills | Status: DC

## 2020-04-19 NOTE — Telephone Encounter (Signed)
Returned call from patient to verify estradiol dosage.  Patient confirmed she was previously on estradiol 1mg  po qd.  Per Dr. Serita Grit note, estradiol is doubled to 2mg  po qd.  Patient was given rx for estradiol 2mg  0.5 tablet daily #30 (was told by pharmacy this would be 60 day supply). New rx w/ refills sent to pharmacy per Joylene John, NP.

## 2020-05-29 ENCOUNTER — Other Ambulatory Visit: Payer: Self-pay

## 2020-05-29 DIAGNOSIS — D3912 Neoplasm of uncertain behavior of left ovary: Secondary | ICD-10-CM

## 2020-05-29 MED ORDER — ESTRADIOL 2 MG PO TABS: 2.0000 mg | ORAL_TABLET | Freq: Every day | ORAL | 3 refills | Status: DC

## 2021-01-29 ENCOUNTER — Telehealth: Payer: Self-pay

## 2021-01-29 NOTE — Telephone Encounter (Signed)
Spoke with [name] this afternoon. Pt appt scheduled for 04/30/2021 at 3:15 pm. Pt agreeable to date and time of appt with [name].

## 2021-04-24 ENCOUNTER — Encounter: Payer: Self-pay | Admitting: Gynecologic Oncology

## 2021-04-30 ENCOUNTER — Encounter: Payer: Self-pay | Admitting: Gynecologic Oncology

## 2021-04-30 ENCOUNTER — Inpatient Hospital Stay (HOSPITAL_BASED_OUTPATIENT_CLINIC_OR_DEPARTMENT_OTHER): Payer: PRIVATE HEALTH INSURANCE | Admitting: Gynecologic Oncology

## 2021-04-30 ENCOUNTER — Inpatient Hospital Stay: Payer: PRIVATE HEALTH INSURANCE | Attending: Gynecologic Oncology

## 2021-04-30 ENCOUNTER — Other Ambulatory Visit: Payer: Self-pay

## 2021-04-30 VITALS — BP 114/80 | HR 86 | Temp 97.5°F | Resp 16 | Ht 65.55 in | Wt 221.7 lb

## 2021-04-30 DIAGNOSIS — D3912 Neoplasm of uncertain behavior of left ovary: Secondary | ICD-10-CM

## 2021-04-30 DIAGNOSIS — Z79890 Hormone replacement therapy (postmenopausal): Secondary | ICD-10-CM | POA: Diagnosis not present

## 2021-04-30 DIAGNOSIS — E28319 Asymptomatic premature menopause: Secondary | ICD-10-CM | POA: Diagnosis not present

## 2021-04-30 DIAGNOSIS — Z8543 Personal history of malignant neoplasm of ovary: Secondary | ICD-10-CM | POA: Diagnosis present

## 2021-04-30 DIAGNOSIS — Z90722 Acquired absence of ovaries, bilateral: Secondary | ICD-10-CM | POA: Insufficient documentation

## 2021-04-30 DIAGNOSIS — Z90710 Acquired absence of both cervix and uterus: Secondary | ICD-10-CM | POA: Insufficient documentation

## 2021-04-30 NOTE — Progress Notes (Signed)
Gynecologic Oncology Return Clinic Visit ? ?04/30/2021 ? 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 ?Treatment History: ?The patient has a longstanding history of ovarian cysts.  She had a cyst that had been observed since May 2019 although it had potentially gone away in the summer 2019 but then a new cyst that developed in March 2020.  When the cyst developed more complex findings on an ultrasound scan on July 30, 2018 in which it measured 4.2 x 3.1 cm and 3.5 x 2 cm with solid areas and complexity.  A decision was made to proceed with surgical excision.  As she had had a complex gynecologic history and infertility, with PCOS, she made a decision for a total abdominal hysterectomy and BSO. ?  ?Dr. Helane Rima performed a total abdominal hysterectomy and BSO at [facility] on October 20, 2018.  Review of the operative note confirms that there was no gross gross extraovarian disease identified.  There was a left adnexal mass that was visualized.  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  It was an adult type tumor.  Washings had not been obtained.  The right ovary was benign.  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 ?  ?Inhibin B was drawn immediately postoperatively on October 21, 2018 and was normal at 10.5. ?  ?A Ca1 25 had been drawn on Jul 02, 2018 and was normal at 18.7. ?  ?Postoperatively she did well with no significant complications. ?  ?On November 11, 2018 she underwent a post surgical staging CT of the abdomen and pelvis with contrast.  This identified a small 2 cm focus of soft tissue in the left adnexa which was indeterminate.  It was potentially either a small benign focus of postsurgical change or residual recurrent tumor and therefore close imaging was recommended.  She had  no lymphadenopathy or other findings concerning for persistent or recurrent disease. ?  ?Repeat CT scan on May 03, 2019 showed a normal bladder and vaginal cuff status post hysterectomy with no pelvic mass, adenopathy or fluid collection.  While this scan was performed at [facility] separate from the prior scan which had been through current health, there were no findings concerning for persistence of the soft tissue focus in the left adnexa. ?  ?She been started on estrogen replacement therapy for surgical menopause symptoms. However reported worsening symptoms of menopause (hot flashes, weight gain).  ?  ?Antimullerian hormone on 05/05/19 was normal at 8.3. ?Inhibin B on 05/05/19 was normal at 8.3.  ? ?Interval History: ?Patient reports overall doing well.  She is currently working on weight loss.  She has come off of metformin and is now on Mounjaro.  She had initially increased her dose of estrogen to 2 mg tablets.  She gained 10-15 pounds in 3 months or so and so is back down to 1 mg tablets again. ? ?Patient denies any pelvic or abdominal pain.  She reports regular bowel and bladder function.  Denies any vaginal bleeding or discharge. ? ?Past Medical/Surgical History: ?Past Medical History:  ?Diagnosis Date  ? Anemia   ? history with pregnancy  ? Complication of anesthesia   ? difficult time waking up after gallbladder surgery, blood pressure drop with C section after babies delivered  ? GERD (  gastroesophageal reflux disease)   ? ovarian ca dx'd 10/21/2018  ? PCOS (polycystic ovarian syndrome)   ? PONV (postoperative nausea and vomiting)   ? Pre-diabetes   ? Unsure, A1C checked since on Metformin for PCOS--metformin has been increased, no official diagnosis of pre-DM  ? Seasonal allergies   ? ? ?Past Surgical History:  ?Procedure Laterality Date  ? ABDOMINAL HYSTERECTOMY  10/20/2018  ? CERVICAL CERCLAGE    ? CESAREAN SECTION    ? CHOLECYSTECTOMY    ? DILATION AND EVACUATION N/A 04/30/2016  ? Procedure:  DILATATION AND EVACUATION;  Surgeon: Dian Queen, MD;  Location: [facility];  Service: Gynecology;  Laterality: N/A;  ? HYSTERECTOMY ABDOMINAL WITH SALPINGO-OOPHORECTOMY Bilateral 10/20/2018  ? Procedure: HYSTERECTOMY ABDOMINAL WITH SALPINGO-OOPHORECTOMY, Lysis of Adhesions;  Surgeon: Dian Queen, MD;  Location: [facility];  Service: Gynecology;  Laterality: Bilateral;  ? ? ?History reviewed. No pertinent family history. ? ?Social History  ? ?Socioeconomic History  ? Marital status: Married  ?  Spouse name: Not on file  ? Number of children: Not on file  ? Years of education: Not on file  ? Highest education level: Not on file  ?Occupational History  ? Not on file  ?Tobacco Use  ? Smoking status: Every Day  ?  Packs/day: 0.25  ?  Types: Cigarettes  ? Smokeless tobacco: Never  ?Vaping Use  ? Vaping Use: Never used  ?Substance and Sexual Activity  ? Alcohol use: Yes  ?  Comment: occasionally  ? Drug use: No  ? Sexual activity: Not Currently  ?  Birth control/protection: None  ?Other Topics Concern  ? Not on file  ?Social History Narrative  ? Not on file  ? ?Social Determinants of Health  ? ?Financial Resource Strain: Not on file  ?Food Insecurity: Not on file  ?Transportation Needs: Not on file  ?Physical Activity: Not on file  ?Stress: Not on file  ?Social Connections: Not on file  ? ? ?Current Medications: ? ?Current Outpatient Medications:  ?  calcium carbonate (TUMS - DOSED IN MG ELEMENTAL CALCIUM) 500 MG chewable tablet, Chew 2 tablets by mouth 2 (two) times daily as needed for indigestion or heartburn., Disp: , Rfl:  ?  Cholecalciferol (VITAMIN D3 PO), Vitamin D3, Disp: , Rfl:  ?  estradiol (ESTRACE) 1 MG tablet, Take 1 mg by mouth daily., Disp: , Rfl:  ?  ibuprofen (ADVIL) 600 MG tablet, Take 1 tablet (600 mg total) by mouth every 6 (six) hours as needed for mild pain or moderate pain. (Patient taking differently: Take 200 mg by mouth every 8 (eight) hours as needed for mild pain or moderate pain.),  Disp: 30 tablet, Rfl: 0 ?  metFORMIN (GLUCOPHAGE) 500 MG tablet, Take 500 mg by mouth 2 (two) times daily with a meal., Disp: , Rfl:  ?  MOUNJARO 2.5 MG/0.5ML Pen, SMARTSIG:2.5 Milligram(s) SUB-Q Once a Week, Disp: , Rfl:  ?  Multiple Vitamin (MULTIVITAMIN WITH MINERALS) TABS tablet, Take 1 tablet by mouth at bedtime., Disp: , Rfl:  ?  acetaminophen (TYLENOL) 500 MG tablet, Take 500-1,000 mg by mouth every 6 (six) hours as needed (for pain.). (Patient not taking: Reported on 04/24/2021), Disp: , Rfl:  ? ?Review of Systems: ?Pertinent positives include headache, anxiety. ?Denies appetite changes, fevers, chills, fatigue, unexplained weight changes. ?Denies hearing loss, neck lumps or masses, mouth sores, ringing in ears or voice changes. ?Denies cough or wheezing.  Denies shortness of breath. ?Denies chest pain or palpitations. Denies leg swelling. ?  Denies abdominal distention, pain, blood in stools, constipation, diarrhea, nausea, vomiting, or early satiety. ?Denies pain with intercourse, dysuria, frequency, hematuria or incontinence. ?Denies hot flashes, pelvic pain, vaginal bleeding or vaginal discharge.   ?Denies joint pain, back pain or muscle pain/cramps. ?Denies itching, rash, or wounds. ?Denies dizziness, numbness or seizures. ?Denies swollen lymph nodes or glands, denies easy bruising or bleeding. ?Denies depression, confusion, or decreased concentration. ? ?Physical Exam: ?BP 114/80 (BP Location: Left Arm, Patient Position: Sitting)   Pulse 86   Temp (!) 97.5 ?F (36.4 ?C) (Oral)   Resp 16   Ht 5' 5.55" (1.665 m)   Wt 221 lb 11.2 oz (100.6 kg)   LMP 01/02/2016   SpO2 100%   BMI 36.28 kg/m?  ?General: Alert, oriented, no acute distress. ?HEENT: Normocephalic, atraumatic, sclera anicteric. ?Chest: Clear to auscultation bilaterally.  No wheezes or rhonchi. ?Cardiovascular: Regular rate and rhythm, no murmurs. ?Abdomen: soft, nontender.  Normoactive bowel sounds.  No masses or hepatosplenomegaly  appreciated.  Well-healed incisions. ?Extremities: Grossly normal range of motion.  Warm, well perfused.  No edema bilaterally. ?Skin: No rashes or lesions noted. ?Lymphatics: No cervical, supraclavicular, or inguinal a

## 2021-04-30 NOTE — Patient Instructions (Addendum)
It was a pleasure to meet you today.  I do not see or feel any evidence of cancer recurrence on your exam. ? ?It does not look like you have MyChart set up.  We can try to help you with this today.  Otherwise, you will receive a call when both blood test return from today. ? ?We will continue with visits every 6 months.  As you will see your OB/GYN in 6 months, I will see you back in a year.  Please call back sometime after the first of next year to schedule a visit with me in March. ? ?If you develop any concerning symptoms before that, such as change to bowel function, abdominal or pelvic pain, unintentional weight loss, please call to see me sooner. ?

## 2021-05-02 LAB — INHIBIN B: Inhibin B: <7 pg/mL

## 2021-05-06 LAB — ANTI MULLERIAN HORMONE: ANTI-MULLERIAN HORMONE (AMH): <0.015 ng/mL

## 2022-07-24 ENCOUNTER — Telehealth: Payer: Self-pay | Admitting: *Deleted

## 2022-07-24 NOTE — Telephone Encounter (Signed)
Attempted to reach [name] after she left a voicemail for the office in regards to making her annual follow up appointment with Dr. Pricilla Holm. Patient was last seen on April 30, 2021 and was advised at that time to call the office in January to schedule a follow up with Dr. Pricilla Holm for March 2024.  Left voicemail for patient to call the office at [phone] to schedule an appointment.

## 2022-07-25 ENCOUNTER — Other Ambulatory Visit: Payer: Self-pay | Admitting: Gynecologic Oncology

## 2022-07-25 DIAGNOSIS — D3912 Neoplasm of uncertain behavior of left ovary: Secondary | ICD-10-CM

## 2022-07-25 NOTE — Telephone Encounter (Signed)
Patient called to make appt. Scheduled for 7/26 at 3:15pm. Patient also states she usually gets labs before her visit. Advised patient this can be scheduled as well. No other concerns at this time.

## 2022-09-03 ENCOUNTER — Encounter: Payer: Self-pay | Admitting: Gynecologic Oncology

## 2022-09-06 ENCOUNTER — Inpatient Hospital Stay: Payer: PRIVATE HEALTH INSURANCE

## 2022-09-06 ENCOUNTER — Other Ambulatory Visit: Payer: Self-pay

## 2022-09-06 ENCOUNTER — Inpatient Hospital Stay: Payer: PRIVATE HEALTH INSURANCE | Attending: Gynecologic Oncology | Admitting: Gynecologic Oncology

## 2022-09-06 ENCOUNTER — Encounter: Payer: Self-pay | Admitting: Gynecologic Oncology

## 2022-09-06 VITALS — BP 126/73 | HR 100 | Temp 98.3°F | Resp 20 | Wt 234.8 lb

## 2022-09-06 DIAGNOSIS — R519 Headache, unspecified: Secondary | ICD-10-CM | POA: Insufficient documentation

## 2022-09-06 DIAGNOSIS — G44009 Cluster headache syndrome, unspecified, not intractable: Secondary | ICD-10-CM

## 2022-09-06 DIAGNOSIS — D3912 Neoplasm of uncertain behavior of left ovary: Secondary | ICD-10-CM

## 2022-09-06 DIAGNOSIS — Z90722 Acquired absence of ovaries, bilateral: Secondary | ICD-10-CM | POA: Diagnosis not present

## 2022-09-06 DIAGNOSIS — Z90710 Acquired absence of both cervix and uterus: Secondary | ICD-10-CM | POA: Insufficient documentation

## 2022-09-06 DIAGNOSIS — F17210 Nicotine dependence, cigarettes, uncomplicated: Secondary | ICD-10-CM | POA: Insufficient documentation

## 2022-09-06 NOTE — Progress Notes (Signed)
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  She had a cyst that had been observed since May 2019 although it had potentially gone away in the summer 2019 but then a new cyst that developed in March 2020.  When the cyst developed more complex findings on an ultrasound scan on July 30, 2018 in which it measured 4.2 x 3.1 cm and 3.5 x 2 cm with solid areas and complexity.  A decision was made to proceed with surgical excision.  As she had had a complex gynecologic history and infertility, with PCOS, she made a decision for a total abdominal hysterectomy and BSO.   Dr. Vincente Poli performed a total abdominal hysterectomy and BSO at [facility] on October 20, 2018.  Review of the operative note confirms that there was no gross gross extraovarian disease identified.  There was a left adnexal mass that was visualized.  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  It was an adult type tumor.  Washings had not been obtained.  The right ovary was benign.  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   Inhibin B was drawn immediately postoperatively on October 21, 2018 and was normal at 10.5.   A Ca1 25 had been drawn on Jul 02, 2018 and was normal at 18.7.   Postoperatively she did well with no significant complications.   On November 11, 2018 she underwent a post surgical staging CT of the abdomen and pelvis with contrast.  This identified a small 2 cm focus of soft tissue in the left adnexa which was indeterminate.  It was potentially either a small benign focus of postsurgical change or residual recurrent tumor and therefore close imaging was recommended.  She had  no lymphadenopathy or other findings concerning for persistent or recurrent disease.   Repeat CT scan on May 03, 2019 showed a normal bladder and vaginal cuff status post hysterectomy with no pelvic mass, adenopathy or fluid collection.  While this scan was performed at [facility] separate from the prior scan which had been through current health, there were no findings concerning for persistence of the soft tissue focus in the left adnexa.   She been started on estrogen replacement therapy for surgical menopause symptoms. However reported worsening symptoms of menopause (hot flashes, weight gain).    Antimullerian hormone on 05/05/19 was normal at 8.3. Inhibin B on 05/05/19 was normal at 8.3.   Interval History: Doing well.  Denies any vaginal leading or discharge.  Denies abdominal pain or pelvic pain.  Reports baseline bowel bladder function.  She has been struggling with some headaches for the last 7-8 months.  Remembers having migraines as a child but has not had them in her adult life.    Past Medical/Surgical History: Past Medical History:  Diagnosis Date   Anemia    history with pregnancy   Complication of anesthesia    difficult time waking up after gallbladder surgery, blood pressure drop with C section after babies delivered   GERD (gastroesophageal reflux disease)    ovarian ca dx'd 10/21/2018   PCOS (polycystic ovarian syndrome)    PONV (postoperative nausea and vomiting)  Pre-diabetes    Unsure, A1C checked since on Metformin for PCOS--metformin has been increased, no official diagnosis of pre-DM   Seasonal allergies     Past Surgical History:  Procedure Laterality Date   ABDOMINAL HYSTERECTOMY  10/20/2018   CERVICAL CERCLAGE     CESAREAN SECTION     CHOLECYSTECTOMY     DILATION AND EVACUATION N/A 04/30/2016   Procedure: DILATATION AND EVACUATION;  Surgeon: Marcelle Overlie, MD;  Location: [facility];  Service: Gynecology;  Laterality: N/A;    HYSTERECTOMY ABDOMINAL WITH SALPINGO-OOPHORECTOMY Bilateral 10/20/2018   Procedure: HYSTERECTOMY ABDOMINAL WITH SALPINGO-OOPHORECTOMY, Lysis of Adhesions;  Surgeon: Marcelle Overlie, MD;  Location: [facility];  Service: Gynecology;  Laterality: Bilateral;    History reviewed. No pertinent family history.  Social History   Socioeconomic History   Marital status: Married    Spouse name: Not on file   Number of children: Not on file   Years of education: Not on file   Highest education level: Not on file  Occupational History   Not on file  Tobacco Use   Smoking status: Every Day    Current packs/day: 0.25    Types: Cigarettes   Smokeless tobacco: Never  Vaping Use   Vaping status: Never Used  Substance and Sexual Activity   Alcohol use: Yes    Comment: occasionally   Drug use: No   Sexual activity: Not Currently    Birth control/protection: None  Other Topics Concern   Not on file  Social History Narrative   Not on file   Social Determinants of Health   Financial Resource Strain: Not on file  Food Insecurity: Not on file  Transportation Needs: Not on file  Physical Activity: Not on file  Stress: Not on file  Social Connections: Unknown (06/15/2021)   Received from [facility]   Social Network    Social Network: Not on file    Current Medications:  Current Outpatient Medications:    calcium carbonate (TUMS - DOSED IN MG ELEMENTAL CALCIUM) 500 MG chewable tablet, Chew 2 tablets by mouth 2 (two) times daily as needed for indigestion or heartburn., Disp: , Rfl:    estradiol (ESTRACE) 1 MG tablet, Take 1 mg by mouth daily., Disp: , Rfl:    ibuprofen (ADVIL) 600 MG tablet, Take 1 tablet (600 mg total) by mouth every 6 (six) hours as needed for mild pain or moderate pain. (Patient taking differently: Take 200 mg by mouth every 8 (eight) hours as needed for mild pain or moderate pain.), Disp: 30 tablet, Rfl: 0   metFORMIN (GLUCOPHAGE) 500 MG tablet, Take 500 mg by mouth 2 (two)  times daily with a meal., Disp: , Rfl:    Multiple Vitamin (MULTIVITAMIN WITH MINERALS) TABS tablet, Take 1 tablet by mouth at bedtime., Disp: , Rfl:    psyllium (METAMUCIL) 58.6 % packet, Take 1 packet by mouth daily., Disp: , Rfl:    Cholecalciferol (VITAMIN D3 PO), Vitamin D3, Disp: , Rfl:   Review of Systems: + shortness of breath, chest pain, hot flashes, headache, anxiety, decreased concentration Denies appetite changes, fevers, chills, fatigue, unexplained weight changes. Denies hearing loss, neck lumps or masses, mouth sores, ringing in ears or voice changes. Denies cough or wheezing.   Denies palpitations. Denies leg swelling. Denies abdominal distention, pain, blood in stools, constipation, diarrhea, nausea, vomiting, or early satiety. Denies pain with intercourse, dysuria, frequency, hematuria or incontinence. Denies pelvic pain, vaginal bleeding or vaginal discharge.   Denies joint pain, back  pain or muscle pain/cramps. Denies itching, rash, or wounds. Denies dizziness, headaches, numbness or seizures. Denies swollen lymph nodes or glands, denies easy bruising or bleeding. Denies depression, confusion, or decreased concentration.  Physical Exam: BP 126/73   Pulse 100   Temp 98.3 F (36.8 C)   Resp 20   Wt 234 lb 12.8 oz (106.5 kg)   LMP 01/02/2016   SpO2 100%   BMI 38.42 kg/m  General: Alert, oriented, no acute distress. HEENT: Normocephalic, atraumatic, sclera anicteric. Chest: Clear to auscultation bilaterally.  No wheezes or rhonchi. Cardiovascular: Regular rate and rhythm, no murmurs. Abdomen: soft, nontender.  Normoactive bowel sounds.  No masses or hepatosplenomegaly appreciated.  Well-healed incisions. Extremities: Grossly normal range of motion.  Warm, well perfused.  No edema bilaterally. Skin: No rashes or lesions noted. Lymphatics: No cervical, supraclavicular, or inguinal adenopathy. GU: Normal appearing external genitalia without erythema, excoriation,  or lesions.  Speculum exam reveals mildly atrophic vaginal mucosa, no lesions or masses, no blood or discharge.  Bimanual exam reveals cuff intact, no nodularity or masses.  Rectovaginal exam confirms these findings.    Laboratory & Radiologic Studies:     Component Ref Range & Units 1 yr ago 3 yr ago  ANTI-MULLERIAN HORMONE (AMH) ng/mL <0.015 <0.015 CM          Component Ref Range & Units 1 yr ago (04/30/21) 3 yr ago (05/05/19) 3 yr ago (10/21/18)  Inhibin B pg/mL <7.0 8.3 CM 10.5 CM    Assessment & Plan: [name] is a 41 y.o. 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  No evidence of disease recurrence.   Patient is NED on exam today.  She is asymptomatic and overall doing well.   Since I saw her last, she has developed pretty frequent headaches.  Has a history of migraines when she was younger.  She has been treating recent headaches with over-the-counter medications.  Has some shortness of breath and chest pain which she attributes to anxiety around her headaches.  Has talked to her PCP.  I encouraged her to reach out to her PCP again and consider seeing a neurologist.  I think she could benefit from potentially starting some suppressive medication.  We will continue with surveillance visits every 6 months alternating between our office and her OB/GYN.  She will see me again in 12 months. She will see Dr Vincente Poli in 6 months.    Inhibin B and AMH were ordered for today.  I will release these to her on MyChart when they results.   Discussed signs and symptoms that would be concerning for disease recurrence and I stressed the importance of calling if she develops any of these between visits.  20 minutes of total time was spent for this patient encounter, including preparation, face-to-face counseling with the patient and coordination of care, and documentation of the encounter.  Eugene Garnet, MD   Division of Gynecologic Oncology  Department of Obstetrics and Gynecology  [facility]

## 2022-09-06 NOTE — Patient Instructions (Signed)
It was good to see you today.  I do not see or feel any evidence of cancer recurrence on your exam.  Please call my office sometime after the new year to schedule a visit to see me this time next year.  I will release your labs from today as they come back.  As always, if you develop any new and concerning symptoms before your next visit, please call to see me sooner.

## 2022-09-12 LAB — ANTI MULLERIAN HORMONE: ANTI-MULLERIAN HORMONE (AMH): <0.015 ng/mL
